# Patient Record
Sex: Female | Born: 1960 | Hispanic: Yes | Marital: Married | Smoking: Never smoker
Health system: Southern US, Community
[De-identification: ages and names within clinical notes are randomized; demographics above are authoritative.]

## PROBLEM LIST (undated history)

## (undated) ENCOUNTER — Emergency Department (HOSPITAL_BASED_OUTPATIENT_CLINIC_OR_DEPARTMENT_OTHER): Payer: BC Managed Care – PPO | Source: Home / Self Care

## (undated) ENCOUNTER — Emergency Department (HOSPITAL_BASED_OUTPATIENT_CLINIC_OR_DEPARTMENT_OTHER): Discharge status: Absent without leave

## (undated) DIAGNOSIS — I10 Essential (primary) hypertension: Secondary | ICD-10-CM

## (undated) DIAGNOSIS — K219 Gastro-esophageal reflux disease without esophagitis: Secondary | ICD-10-CM

## (undated) HISTORY — PX: CHOLECYSTECTOMY: SHX55

---

## 1998-06-09 ENCOUNTER — Other Ambulatory Visit: Admission: RE | Admit: 1998-06-09 | Discharge: 1998-06-09 | Payer: Self-pay | Admitting: Gynecology

## 1998-07-28 ENCOUNTER — Emergency Department (HOSPITAL_COMMUNITY): Admission: EM | Admit: 1998-07-28 | Discharge: 1998-07-29 | Payer: Self-pay | Admitting: Emergency Medicine

## 2000-11-27 ENCOUNTER — Emergency Department (HOSPITAL_COMMUNITY): Admission: EM | Admit: 2000-11-27 | Discharge: 2000-11-27 | Payer: Self-pay | Admitting: *Deleted

## 2000-11-27 ENCOUNTER — Encounter: Payer: Self-pay | Admitting: Internal Medicine

## 2001-09-18 ENCOUNTER — Emergency Department (HOSPITAL_COMMUNITY): Admission: EM | Admit: 2001-09-18 | Discharge: 2001-09-18 | Payer: Self-pay | Admitting: Emergency Medicine

## 2007-01-27 ENCOUNTER — Emergency Department (HOSPITAL_COMMUNITY): Admission: EM | Admit: 2007-01-27 | Discharge: 2007-01-27 | Payer: Self-pay | Admitting: Emergency Medicine

## 2008-04-14 LAB — CONVERTED CEMR LAB: Pap Smear: NORMAL

## 2008-05-01 ENCOUNTER — Encounter: Payer: Self-pay | Admitting: Internal Medicine

## 2008-06-12 ENCOUNTER — Inpatient Hospital Stay (HOSPITAL_COMMUNITY): Admission: AD | Admit: 2008-06-12 | Discharge: 2008-06-12 | Payer: Self-pay | Admitting: Obstetrics and Gynecology

## 2008-09-29 ENCOUNTER — Ambulatory Visit: Payer: Self-pay | Admitting: Internal Medicine

## 2008-09-29 DIAGNOSIS — E785 Hyperlipidemia, unspecified: Secondary | ICD-10-CM

## 2008-09-29 DIAGNOSIS — I10 Essential (primary) hypertension: Secondary | ICD-10-CM | POA: Insufficient documentation

## 2010-01-28 ENCOUNTER — Emergency Department (HOSPITAL_BASED_OUTPATIENT_CLINIC_OR_DEPARTMENT_OTHER): Admission: EM | Admit: 2010-01-28 | Discharge: 2010-01-28 | Payer: Self-pay | Admitting: Emergency Medicine

## 2010-01-28 ENCOUNTER — Ambulatory Visit: Payer: Self-pay | Admitting: Diagnostic Radiology

## 2010-06-29 ENCOUNTER — Emergency Department (HOSPITAL_BASED_OUTPATIENT_CLINIC_OR_DEPARTMENT_OTHER)
Admission: EM | Admit: 2010-06-29 | Discharge: 2010-06-29 | Disposition: A | Discharge status: Other Acute Inpatient Hospital | Payer: Self-pay | Source: Home / Self Care | Admitting: Emergency Medicine

## 2010-06-29 ENCOUNTER — Inpatient Hospital Stay (HOSPITAL_COMMUNITY)
Admission: AD | Admit: 2010-06-29 | Discharge: 2010-06-30 | Payer: Self-pay | Source: Home / Self Care | Attending: Internal Medicine | Admitting: Internal Medicine

## 2010-06-30 LAB — BASIC METABOLIC PANEL
BUN: 14 mg/dL (ref 6–23)
CO2: 27 mEq/L (ref 19–32)
Calcium: 9.7 mg/dL (ref 8.4–10.5)
Chloride: 106 mEq/L (ref 96–112)
Creatinine, Ser: 0.7 mg/dL (ref 0.4–1.2)
GFR calc Af Amer: >60 mL/min (ref >60)
GFR calc non Af Amer: >60 mL/min (ref >60)
Glucose, Bld: 89 mg/dL (ref 70–99)
Potassium: 3.9 mEq/L (ref 3.5–5.1)
Sodium: 146 mEq/L — ABNORMAL HIGH (ref 135–145)

## 2010-06-30 LAB — CARDIAC PANEL(CRET KIN+CKTOT+MB+TROPI)
CK, MB: 2.2 ng/mL (ref 0.3–4.0)
Relative Index: 1.4 (ref 0.0–2.5)
Total CK: 152 U/L (ref 7–177)
Troponin I: <0.01 ng/mL (ref 0.00–0.06)

## 2010-06-30 LAB — DIFFERENTIAL
Basophils Absolute: 0 10*3/uL (ref 0.0–0.1)
Basophils Relative: 1 % (ref 0–1)
Eosinophils Absolute: 0.1 10*3/uL (ref 0.0–0.7)
Eosinophils Relative: 1 % (ref 0–5)
Lymphocytes Relative: 46 % (ref 12–46)
Lymphs Abs: 3 10*3/uL (ref 0.7–4.0)
Monocytes Absolute: 0.4 10*3/uL (ref 0.1–1.0)
Monocytes Relative: 6 % (ref 3–12)
Neutro Abs: 2.9 10*3/uL (ref 1.7–7.7)
Neutrophils Relative %: 46 % (ref 43–77)

## 2010-06-30 LAB — CBC
HCT: 37.5 % (ref 36.0–46.0)
Hemoglobin: 12.5 g/dL (ref 12.0–15.0)
MCH: 26.5 pg (ref 26.0–34.0)
MCHC: 33.3 g/dL (ref 30.0–36.0)
MCV: 79.4 fL (ref 78.0–100.0)
Platelets: 266 10*3/uL (ref 150–400)
RBC: 4.72 MIL/uL (ref 3.87–5.11)
RDW: 13.4 % (ref 11.5–15.5)
WBC: 6.4 10*3/uL (ref 4.0–10.5)

## 2010-06-30 LAB — POCT CARDIAC MARKERS
CKMB, poc: 2.4 ng/mL (ref 1.0–8.0)
Myoglobin, poc: 55.1 ng/mL (ref 12–200)
Troponin i, poc: <0.05 ng/mL (ref 0.00–0.09)

## 2010-06-30 LAB — D-DIMER, QUANTITATIVE: D-Dimer, Quant: <0.22 ug/mL-FEU (ref 0.00–0.48)

## 2010-07-05 LAB — HEMOGLOBIN A1C
Hgb A1c MFr Bld: 5.8 % — ABNORMAL HIGH (ref <5.7)
Mean Plasma Glucose: 120 mg/dL — ABNORMAL HIGH (ref <117)

## 2010-07-05 LAB — TSH: TSH: 2.152 u[IU]/mL (ref 0.350–4.500)

## 2010-07-05 NOTE — Discharge Summary (Addendum)
  NAMEERIK, NESSEL             ACCOUNT NO.:  [id number]  MEDICAL RECORD NO.:  [id number]          PATIENT TYPE:  INP  LOCATION:  2038                         FACILITY:  [facility]  PHYSICIAN:  Tarry Kos, MD       DATE OF BIRTH:  February 09, 1961  DATE OF ADMISSION:  06/29/2010 DATE OF DISCHARGE:  06/30/2010                              DISCHARGE SUMMARY   DISCHARGE DIAGNOSES: 1. Atypical chest pain/left shoulder pain. 2. Newly-diagnosed hypertension.  SUMMARY OF HOSPITAL COURSE:  Ms. Fruchter is a pleasant 50 year old African American female who presented to the emergency room with atypical chest pain.  Please see admitting H and P for details.  She was admitted.  She had serial cardiac enzymes, which were all negative.  She was found to be hypertensive and was started on low-dose metoprolol.  PHYSICAL EXAMINATION:  VITAL SIGNS:  She had consistent systolic blood pressures over 161.  This morning, it is 136/86 with a pulse of 72.  She has been afebrile.  Her vital signs have been stable. GENERAL:  She is alert and oriented x4, no apparent distress, cooperative fairly. HEART:  Regular rate and rhythm without murmurs, rubs, or gallops. CHEST:  Clear to auscultation bilaterally.  No wheezing or rales. ABDOMEN:  Soft, nontender, nondistended.  Positive bowel sounds.  No hepatomegaly. EXTREMITIES:  No clubbing, cyanosis, or edema. PSYCHIATRIC:  Normal mood and affect. NEUROLOGIC:  No focal neurologic deficits. SKIN:  No rashes.  Her serial cardiac enzymes were all negative.  Her TSH was normal. Hemoglobin A1c was 5.8%.  D-dimer was negative.  Electrolytes normal. BUN and creatinine normal.  White count normal.  Chest x-ray negative.  The patient is being discharged home to follow up with her primary care physician in 1-2 weeks.  She is being discharged on the following medications, which are new to her. 1. Aspirin 81 mg a day. 2. Metoprolol 12.5 mg twice a day. 3. Multivitamin  a day.  Continue home medication:  Meloxicam 15 mg daily as needed for pain.  She will be discharged in improved and stable condition.          ______________________________ Tarry Kos, MD     RD/MEDQ  D:  06/30/2010  T:  07/01/2010  Job:  [id number]  Electronically Signed by Eldridge Dace MD on 07/05/2010 01:53:19 PM

## 2010-07-10 NOTE — H&P (Addendum)
Christina [name], Christina [name]             ACCOUNT NO.:  [id number]  MEDICAL RECORD NO.:  [id number]          PATIENT TYPE:  INP  LOCATION:  2038                         FACILITY:  [facility]  PHYSICIAN:  Michiel Cowboy, MDDATE OF BIRTH:  February 14, 1961  DATE OF ADMISSION:  06/29/2010 DATE OF DISCHARGE:                             HISTORY & PHYSICAL   PRIMARY CARE PROVIDER:  Rafaela Aguillera   CHIEF COMPLAINT:  Left shoulder pain and feeling unwell.  HISTORY OF PRESENT ILLNESS:  The patient is a 50 year old female with past medical history significant for intermittent elevated blood pressure who yesterday started to have  intermittent pain in the left shoulder which is somewhat reproducible per the patient.  She thought maybe she pulled a muscle.  Today when she woke up, she felt unwell, somewhat lightheaded and she actually stated she might have felt somewhat confused but this was short-lived.  But she wanted to get this investigated and she presented to emergency department in ER.  Her chest pain almost completely resolved but [facility] hospitalist was called for an admission to rule out chest pain given elevated blood pressure which is untreated.  REVIEW OF SYSTEMS:  She denies any shortness of breath.  No fevers, no chills, no cough.  The pain is nonpleuritic.  Otherwise review of systems is negative.  PAST MEDICAL HISTORY:  Significant for occasionally elevated blood pressure which she thought was secondary to her being stressed out whenever she gets her blood pressure checked at the physicians.  But she stated sometimes it gets elevated when she is also checking it at [facility].  Also has history of dysmenorrhea.  SOCIAL HISTORY:  The patient does not smoke or drink; does not abuse drugs.  Married with two children.  FAMILY HISTORY:  Significant for father who died of coronary artery disease at age of 27.  Her mother is still living and healthy.  ALLERGIES:  The patient states  repeatedly her mother has allergies to penicillin.  But she does not know if she has one.  She just never tried to take  any penicillin.  SO ACTUALLY SHE DOES NOT HAVE ANY KNOWN DRUG ALLERGIES.  MEDICATIONS:  She occasionally takes nSAIDS as needed for back pains and multivitamins.  PHYSICAL EXAMINATION:  VITALS:  Temperature 160/95, pulse 60, satting 100% on room air, respirations 16, temperature 98.3.  Of note, when the patient presented to the emergency department, her blood pressure was elevated at 191/98.  LABORATORY DATA:  White blood cell count 6.4, hemoglobin 12.5.  Sodium 146, potassium 3.9, creatinine 0.7.  Cardiac enzymes unremarkable. Chest x-ray is not showing any cardiopulmonary disease.  EKG:  The one that was sent from [facility] ED, is actually on the wrong patient.  No EKG is in the  system.  ASSESSMENT/PLAN:  This is a 50 year old female with what I think is now diagnosis of hypertension given recurrent episodes of elevated blood pressure in the office and chest pain. 1. Chest pain.  Given her risk factors, will admit.  Cycle cardiac     enzymes, check fasting lipid panel, hemoglobin A1c.  Check TSH. 2. Hypertension.  This is  a new diagnosis.  Will start her right now     on metoprolol and p.r.n. hydralazine. 3. Prophylaxis Protonix and Lovenox. Otherwise the rest of the plan as per orders.      Michiel Cowboy, MD     AVD/MEDQ  D:  06/29/2010  T:  06/29/2010  Job:  [id number]  cc:   Katrinka Blazing  Electronically Signed by Therisa Doyne MD on 07/10/2010 06:21:53 AM

## 2010-08-27 LAB — BASIC METABOLIC PANEL
BUN: 15 mg/dL (ref 6–23)
CO2: 27 mEq/L (ref 19–32)
Calcium: 9.6 mg/dL (ref 8.4–10.5)
Chloride: 106 mEq/L (ref 96–112)
Creatinine, Ser: 0.7 mg/dL (ref 0.4–1.2)
GFR calc Af Amer: >60 mL/min (ref >60)
GFR calc non Af Amer: >60 mL/min (ref >60)
Glucose, Bld: 86 mg/dL (ref 70–99)
Potassium: 4.1 mEq/L (ref 3.5–5.1)
Sodium: 144 mEq/L (ref 135–145)

## 2010-08-27 LAB — DIFFERENTIAL
Basophils Absolute: 0.1 10*3/uL (ref 0.0–0.1)
Basophils Relative: 1 % (ref 0–1)
Eosinophils Absolute: 0.1 10*3/uL (ref 0.0–0.7)
Eosinophils Relative: 1 % (ref 0–5)
Lymphocytes Relative: 41 % (ref 12–46)
Lymphs Abs: 2.9 10*3/uL (ref 0.7–4.0)
Monocytes Absolute: 0.5 10*3/uL (ref 0.1–1.0)
Monocytes Relative: 7 % (ref 3–12)
Neutro Abs: 3.5 10*3/uL (ref 1.7–7.7)
Neutrophils Relative %: 50 % (ref 43–77)

## 2010-08-27 LAB — CBC
HCT: 40.7 % (ref 36.0–46.0)
Hemoglobin: 13.4 g/dL (ref 12.0–15.0)
MCH: 27.1 pg (ref 26.0–34.0)
MCHC: 33 g/dL (ref 30.0–36.0)
MCV: 82.2 fL (ref 78.0–100.0)
Platelets: 280 10*3/uL (ref 150–400)
RBC: 4.95 MIL/uL (ref 3.87–5.11)
RDW: 12.7 % (ref 11.5–15.5)
WBC: 7.1 10*3/uL (ref 4.0–10.5)

## 2010-08-27 LAB — POCT CARDIAC MARKERS
CKMB, poc: 1.4 ng/mL (ref 1.0–8.0)
CKMB, poc: <1 ng/mL — ABNORMAL LOW (ref 1.0–8.0)
Myoglobin, poc: 30.3 ng/mL (ref 12–200)
Myoglobin, poc: 50 ng/mL (ref 12–200)
Troponin i, poc: <0.05 ng/mL (ref 0.00–0.09)
Troponin i, poc: <0.05 ng/mL (ref 0.00–0.09)

## 2011-03-18 LAB — POCT PREGNANCY, URINE: Preg Test, Ur: NEGATIVE

## 2011-03-22 ENCOUNTER — Emergency Department (HOSPITAL_COMMUNITY)
Admission: EM | Admit: 2011-03-22 | Discharge: 2011-03-22 | Disposition: A | Discharge status: Home / Self Care | Payer: Managed Care, Other (non HMO) | Attending: Emergency Medicine | Admitting: Emergency Medicine

## 2011-03-22 DIAGNOSIS — E785 Hyperlipidemia, unspecified: Secondary | ICD-10-CM | POA: Insufficient documentation

## 2011-03-22 DIAGNOSIS — I10 Essential (primary) hypertension: Secondary | ICD-10-CM | POA: Insufficient documentation

## 2011-03-22 DIAGNOSIS — N946 Dysmenorrhea, unspecified: Secondary | ICD-10-CM | POA: Insufficient documentation

## 2011-03-22 DIAGNOSIS — R109 Unspecified abdominal pain: Secondary | ICD-10-CM | POA: Insufficient documentation

## 2011-03-22 LAB — URINALYSIS, ROUTINE W REFLEX MICROSCOPIC
Bilirubin Urine: NEGATIVE
Glucose, UA: NEGATIVE mg/dL
Ketones, ur: NEGATIVE mg/dL
Leukocytes, UA: NEGATIVE
Nitrite: NEGATIVE
Protein, ur: NEGATIVE mg/dL
Specific Gravity, Urine: 1.012 (ref 1.005–1.030)
Urobilinogen, UA: 0.2 mg/dL (ref 0.0–1.0)
pH: 8 (ref 5.0–8.0)

## 2011-03-22 LAB — URINE MICROSCOPIC-ADD ON

## 2011-03-22 LAB — POCT PREGNANCY, URINE: Preg Test, Ur: NEGATIVE

## 2011-03-25 LAB — URINE MICROSCOPIC-ADD ON

## 2011-03-25 LAB — DIFFERENTIAL
Lymphs Abs: 0.9
Monocytes Relative: 5
Neutro Abs: 10.8 — ABNORMAL HIGH
Neutrophils Relative %: 87 — ABNORMAL HIGH

## 2011-03-25 LAB — CBC
HCT: 40.3
Hemoglobin: 13.6
MCHC: 33.7
MCV: 81.1
Platelets: 312
RBC: 4.97
RDW: 13.1
WBC: 12.4 — ABNORMAL HIGH

## 2011-03-25 LAB — URINALYSIS, ROUTINE W REFLEX MICROSCOPIC
Bilirubin Urine: NEGATIVE
Glucose, UA: NEGATIVE
Ketones, ur: NEGATIVE
Leukocytes, UA: NEGATIVE
Nitrite: NEGATIVE
Protein, ur: 30 — AB
Specific Gravity, Urine: 1.031 — ABNORMAL HIGH
Urobilinogen, UA: 1
pH: 5.5

## 2011-03-25 LAB — COMPREHENSIVE METABOLIC PANEL
BUN: 14
Calcium: 9.5
Creatinine, Ser: 0.75
Glucose, Bld: 124 — ABNORMAL HIGH
Total Protein: 7.6

## 2011-03-25 LAB — PREGNANCY, URINE: Preg Test, Ur: NEGATIVE

## 2011-08-10 ENCOUNTER — Encounter (HOSPITAL_COMMUNITY): Payer: Self-pay | Admitting: Emergency Medicine

## 2011-08-10 ENCOUNTER — Emergency Department (HOSPITAL_COMMUNITY): Payer: Managed Care, Other (non HMO)

## 2011-08-10 ENCOUNTER — Emergency Department (HOSPITAL_COMMUNITY)
Admission: EM | Admit: 2011-08-10 | Discharge: 2011-08-10 | Disposition: A | Discharge status: Home / Self Care | Payer: Managed Care, Other (non HMO) | Attending: Emergency Medicine | Admitting: Emergency Medicine

## 2011-08-10 DIAGNOSIS — I10 Essential (primary) hypertension: Secondary | ICD-10-CM | POA: Insufficient documentation

## 2011-08-10 DIAGNOSIS — K5900 Constipation, unspecified: Secondary | ICD-10-CM | POA: Insufficient documentation

## 2011-08-10 DIAGNOSIS — R109 Unspecified abdominal pain: Secondary | ICD-10-CM | POA: Insufficient documentation

## 2011-08-10 DIAGNOSIS — R141 Gas pain: Secondary | ICD-10-CM | POA: Insufficient documentation

## 2011-08-10 DIAGNOSIS — R142 Eructation: Secondary | ICD-10-CM | POA: Insufficient documentation

## 2011-08-10 HISTORY — DX: Essential (primary) hypertension: I10

## 2011-08-10 LAB — URINALYSIS, ROUTINE W REFLEX MICROSCOPIC
Bilirubin Urine: NEGATIVE
Glucose, UA: NEGATIVE mg/dL
Ketones, ur: NEGATIVE mg/dL
Nitrite: NEGATIVE
pH: 6 (ref 5.0–8.0)

## 2011-08-10 LAB — HEPATIC FUNCTION PANEL
ALT: 21 U/L (ref 0–35)
AST: 18 U/L (ref 0–37)
Alkaline Phosphatase: 109 U/L (ref 39–117)
Total Protein: 7.8 g/dL (ref 6.0–8.3)

## 2011-08-10 LAB — DIFFERENTIAL
Eosinophils Relative: 1 % (ref 0–5)
Lymphocytes Relative: 44 % (ref 12–46)
Lymphs Abs: 3.7 10*3/uL (ref 0.7–4.0)
Monocytes Absolute: 0.6 10*3/uL (ref 0.1–1.0)
Neutro Abs: 3.9 10*3/uL (ref 1.7–7.7)

## 2011-08-10 LAB — CBC
HCT: 39.1 % (ref 36.0–46.0)
MCV: 82 fL (ref 78.0–100.0)
Platelets: 285 10*3/uL (ref 150–400)
RBC: 4.77 MIL/uL (ref 3.87–5.11)
WBC: 8.2 10*3/uL (ref 4.0–10.5)

## 2011-08-10 LAB — URINE MICROSCOPIC-ADD ON

## 2011-08-10 LAB — BASIC METABOLIC PANEL
BUN: 14 mg/dL (ref 6–23)
CO2: 27 mEq/L (ref 19–32)
Chloride: 103 mEq/L (ref 96–112)
Glucose, Bld: 100 mg/dL — ABNORMAL HIGH (ref 70–99)
Potassium: 3.8 mEq/L (ref 3.5–5.1)
Sodium: 138 mEq/L (ref 135–145)

## 2011-08-10 MED ORDER — MORPHINE SULFATE 4 MG/ML IJ SOLN
4.0000 mg | Freq: Once | INTRAMUSCULAR | Status: AC
  Administered 2011-08-10: 4 mg via INTRAVENOUS
  Filled 2011-08-10: qty 1

## 2011-08-10 MED ORDER — SODIUM CHLORIDE 0.9 % IV BOLUS (SEPSIS)
1000.0000 mL | Freq: Once | INTRAVENOUS | Status: AC
  Administered 2011-08-10: 1000 mL via INTRAVENOUS

## 2011-08-10 MED ORDER — IOHEXOL 300 MG/ML  SOLN
100.0000 mL | Freq: Once | INTRAMUSCULAR | Status: AC | PRN
  Administered 2011-08-10: 100 mL via INTRAVENOUS

## 2011-08-10 MED ORDER — ONDANSETRON HCL 4 MG/2ML IJ SOLN
4.0000 mg | Freq: Once | INTRAMUSCULAR | Status: AC
  Administered 2011-08-10: 4 mg via INTRAVENOUS
  Filled 2011-08-10: qty 2

## 2011-08-10 MED ORDER — POLYETHYLENE GLYCOL 3350 17 GM/SCOOP PO POWD: 17.0000 g | Freq: Every day | ORAL | Status: AC

## 2011-08-10 NOTE — ED Notes (Signed)
Pt instructed that she may feel funny after meds given and she did she is getting better now.  A friend is here to sit with her

## 2011-08-10 NOTE — Discharge Instructions (Signed)
Return to the ED with any concerns including vomiting, not able to keep down liquids, fever, worsening pain, decreased level of alertness/lethargy, or any other alarming symptoms

## 2011-08-10 NOTE — ED Notes (Signed)
Pt alert, nad, c/o left abd pain, onset last pm, denies changes in bowels or bladder, pain is non radiating

## 2011-08-10 NOTE — ED Provider Notes (Signed)
History     CSN: [id number]  Arrival date & time 08/10/11  4098   First MD Initiated Contact with Patient 08/10/11 [phone]      Chief Complaint  Patient presents with  . Abdominal Pain    (Consider location/radiation/quality/duration/timing/severity/associated sxs/prior treatment) HPI Patient presents with complaint of pain in the left side of her abdomen. She states the pain began yesterday and has been constant. She describes the pain as sharp. She denies any changes in her bowel movements and last stool was yesterday without blood. She denies dysuria, urinary urgency or frequency. She's had no fevers or chills or vomiting. Pain is worse with palpation. She has not had similar pain in the past. There no other associated systemic symptoms. There no other alleviating or modifying factors associated.  Past Medical History  Diagnosis Date  . Hypertension     Past Surgical History  Procedure Date  . Cholecystectomy   . Cesarean section     No family history on file.  History  Substance Use Topics  . Smoking status: Never Smoker   . Smokeless tobacco: Not on file  . Alcohol Use:     OB History    Grav Para Term Preterm Abortions TAB SAB Ect Mult Living                  Review of Systems ROS reviewed and otherwise negative except for mentioned in HPI  Allergies  Review of patient's allergies indicates no known allergies.  Home Medications   Current Outpatient Rx  Name Route Sig Dispense Refill  . METOPROLOL SUCCINATE ER 50 MG PO TB24 Oral Take 25 mg by mouth daily. Take with or immediately following a meal.    . POLYETHYLENE GLYCOL 3350 PO POWD Oral Take 17 g by mouth daily. 255 g 0    BP 138/81  Pulse 73  Temp(Src) 98.6 F (37 C) (Oral)  Resp 16  Wt 202 lb (91.627 kg)  SpO2 100% Vitals reviewed Physical Exam Physical Examination: General appearance - alert, well appearing, and in no distress Mental status - alert, oriented to person, place, and time Mouth  - mucous membranes moist, pharynx normal without lesions Chest - clear to auscultation, no wheezes, rales or rhonchi, symmetric air entry Heart - normal rate, regular rhythm, normal S1, S2, no murmurs, rubs, clicks or gallops Abdomen - soft, mild tenderness to palpation in left mid abdomen, no gaurding or rebound tenderness, nondistended, no masses or organomegaly, NABS Extremities - peripheral pulses normal, no pedal edema, no clubbing or cyanosis Skin - normal coloration and turgor, no rashes  ED Course  Procedures (including critical care time)  Labs Reviewed  URINALYSIS, ROUTINE W REFLEX MICROSCOPIC - Abnormal; Notable for the following:    APPearance CLOUDY (*)    Hgb urine dipstick TRACE (*)    All other components within normal limits  BASIC METABOLIC PANEL - Abnormal; Notable for the following:    Glucose, Bld 100 (*)    All other components within normal limits  URINE MICROSCOPIC-ADD ON - Abnormal; Notable for the following:    Squamous Epithelial / LPF MANY (*)    All other components within normal limits  CBC  DIFFERENTIAL  HEPATIC FUNCTION PANEL  LIPASE, BLOOD   Ct Abdomen Pelvis W Contrast  08/10/2011  *RADIOLOGY REPORT*  Clinical Data: Left upper quadrant and epigastric pain.  Abdominal distention.  CT ABDOMEN AND PELVIS WITH CONTRAST  Technique:  Multidetector CT imaging of the abdomen and pelvis was  performed following the standard protocol during bolus administration of intravenous contrast.  Contrast:  100 ml Omnipaque-300.  Comparison: None.  Findings: Lung bases show no acute findings.  Heart size normal. No pericardial or pleural effusion.  Partially imaged probable right infrahilar pericardial recess (image 1).  Liver is unremarkable.  Cholecystectomy.  A 7 mm low attenuation lesion in the interpolar right kidney is too small to characterize. Kidneys, spleen, pancreas, stomach and small bowel are unremarkable.  A fair amount of stool is present in the colon.  Uterus  and ovaries are visualized.  No pathologically enlarged lymph nodes.  No free fluid.  No worrisome lytic or sclerotic lesions. A focal area of smudgy sclerosis in the L3 vertebral body has a benign imaging features.  IMPRESSION:  1.  No acute findings. 2.  Suspect mild constipation.  Original Report Authenticated By: Reyes Ivan, M.D.     1. Abdominal pain   2. Constipation       MDM  Patient presenting with abdominal pain in her left lower abdomen. Her laboratory workup was reassuring. A CT scan reveals no acute findings but possibility of constipation. Patient will be discharged with prescription to start MiraLAX. She is nontoxic and well-hydrated in appearance. She was given strict return precautions and is agreeable with the plan for discharge.        Ethelda Chick, MD 08/10/11 1110

## 2011-08-13 ENCOUNTER — Emergency Department (INDEPENDENT_AMBULATORY_CARE_PROVIDER_SITE_OTHER): Payer: Managed Care, Other (non HMO)

## 2011-08-13 ENCOUNTER — Emergency Department (HOSPITAL_BASED_OUTPATIENT_CLINIC_OR_DEPARTMENT_OTHER)
Admission: EM | Admit: 2011-08-13 | Discharge: 2011-08-13 | Disposition: A | Discharge status: Home / Self Care | Payer: Managed Care, Other (non HMO) | Attending: Emergency Medicine | Admitting: Emergency Medicine

## 2011-08-13 ENCOUNTER — Encounter (HOSPITAL_BASED_OUTPATIENT_CLINIC_OR_DEPARTMENT_OTHER): Payer: Self-pay | Admitting: *Deleted

## 2011-08-13 ENCOUNTER — Other Ambulatory Visit: Payer: Self-pay

## 2011-08-13 DIAGNOSIS — Z79899 Other long term (current) drug therapy: Secondary | ICD-10-CM | POA: Insufficient documentation

## 2011-08-13 DIAGNOSIS — I10 Essential (primary) hypertension: Secondary | ICD-10-CM | POA: Insufficient documentation

## 2011-08-13 DIAGNOSIS — R110 Nausea: Secondary | ICD-10-CM | POA: Insufficient documentation

## 2011-08-13 DIAGNOSIS — K5900 Constipation, unspecified: Secondary | ICD-10-CM

## 2011-08-13 DIAGNOSIS — R1013 Epigastric pain: Secondary | ICD-10-CM | POA: Insufficient documentation

## 2011-08-13 LAB — URINALYSIS, ROUTINE W REFLEX MICROSCOPIC
Nitrite: NEGATIVE
Protein, ur: NEGATIVE mg/dL
Specific Gravity, Urine: 1.024 (ref 1.005–1.030)
Urobilinogen, UA: 2 mg/dL — ABNORMAL HIGH (ref 0.0–1.0)

## 2011-08-13 LAB — COMPREHENSIVE METABOLIC PANEL
Alkaline Phosphatase: 140 U/L — ABNORMAL HIGH (ref 39–117)
BUN: 15 mg/dL (ref 6–23)
CO2: 27 mEq/L (ref 19–32)
Chloride: 105 mEq/L (ref 96–112)
Creatinine, Ser: 0.7 mg/dL (ref 0.50–1.10)
GFR calc Af Amer: >90 mL/min (ref >90)
GFR calc non Af Amer: >90 mL/min (ref >90)
Glucose, Bld: 107 mg/dL — ABNORMAL HIGH (ref 70–99)
Potassium: 3.6 mEq/L (ref 3.5–5.1)
Total Bilirubin: 0.3 mg/dL (ref 0.3–1.2)

## 2011-08-13 LAB — DIFFERENTIAL
Basophils Relative: 0 % (ref 0–1)
Lymphs Abs: 3.1 10*3/uL (ref 0.7–4.0)
Monocytes Absolute: 0.5 10*3/uL (ref 0.1–1.0)
Monocytes Relative: 7 % (ref 3–12)
Neutro Abs: 3.5 10*3/uL (ref 1.7–7.7)
Neutrophils Relative %: 48 % (ref 43–77)

## 2011-08-13 LAB — CBC
HCT: 37 % (ref 36.0–46.0)
Hemoglobin: 12 g/dL (ref 12.0–15.0)
MCHC: 32.4 g/dL (ref 30.0–36.0)
RBC: 4.55 MIL/uL (ref 3.87–5.11)

## 2011-08-13 LAB — LIPASE, BLOOD: Lipase: 56 U/L (ref 11–59)

## 2011-08-13 LAB — TROPONIN I: Troponin I: <0.3 ng/mL (ref <0.30)

## 2011-08-13 LAB — URINE MICROSCOPIC-ADD ON

## 2011-08-13 MED ORDER — PANTOPRAZOLE SODIUM 40 MG IV SOLR
40.0000 mg | Freq: Once | INTRAVENOUS | Status: AC
  Administered 2011-08-13: 40 mg via INTRAVENOUS
  Filled 2011-08-13: qty 40

## 2011-08-13 MED ORDER — DIPHENHYDRAMINE HCL 50 MG/ML IJ SOLN
25.0000 mg | Freq: Once | INTRAMUSCULAR | Status: AC
  Administered 2011-08-13: 25 mg via INTRAVENOUS
  Filled 2011-08-13: qty 1

## 2011-08-13 MED ORDER — ONDANSETRON HCL 4 MG/2ML IJ SOLN
4.0000 mg | Freq: Once | INTRAMUSCULAR | Status: AC
  Administered 2011-08-13: 4 mg via INTRAVENOUS
  Filled 2011-08-13: qty 2

## 2011-08-13 MED ORDER — SODIUM CHLORIDE 0.9 % IV BOLUS (SEPSIS)
1000.0000 mL | Freq: Once | INTRAVENOUS | Status: AC
  Administered 2011-08-13: 1000 mL via INTRAVENOUS

## 2011-08-13 MED ORDER — GI COCKTAIL ~~LOC~~
30.0000 mL | Freq: Once | ORAL | Status: AC
  Administered 2011-08-13: 30 mL via ORAL
  Filled 2011-08-13: qty 30

## 2011-08-13 MED ORDER — SENNOSIDES-DOCUSATE SODIUM 8.6-50 MG PO TABS: 1.0000 | ORAL_TABLET | Freq: Every day | ORAL | Status: DC

## 2011-08-13 MED ORDER — BISACODYL 10 MG RE SUPP
10.0000 mg | Freq: Once | RECTAL | Status: AC
  Administered 2011-08-13: 10 mg via RECTAL
  Filled 2011-08-13: qty 1

## 2011-08-13 MED ORDER — POLYETHYLENE GLYCOL 3350 17 GM/SCOOP PO POWD: ORAL | Status: DC

## 2011-08-13 MED ORDER — HYDROMORPHONE HCL PF 1 MG/ML IJ SOLN
1.0000 mg | Freq: Once | INTRAMUSCULAR | Status: AC
  Administered 2011-08-13: 1 mg via INTRAVENOUS
  Filled 2011-08-13: qty 1

## 2011-08-13 NOTE — ED Notes (Signed)
Pt presents to ED today with LUQ pain for the last week.  Pt was seen at [facility] and discharged.

## 2011-08-13 NOTE — Discharge Instructions (Signed)
Constipation in Adults Constipation is having fewer than 2 bowel movements per week. Usually, the stools are hard. As we grow older, constipation is more common. If you try to fix constipation with laxatives, the problem may get worse. This is because laxatives taken over a long period of time make the colon muscles weaker. A low-fiber diet, not taking in enough fluids, and taking some medicines may make these problems worse. MEDICATIONS THAT MAY CAUSE CONSTIPATION  Water pills (diuretics).   Calcium channel blockers (used to control blood pressure and for the heart).   Certain pain medicines (narcotics).   Anticholinergics.   Anti-inflammatory agents.   Antacids that contain aluminum.  DISEASES THAT CONTRIBUTE TO CONSTIPATION  Diabetes.   Parkinson's disease.   Dementia.   Stroke.   Depression.   Illnesses that cause problems with salt and water metabolism.  HOME CARE INSTRUCTIONS   Constipation is usually best cared for without medicines. Increasing dietary fiber and eating more fruits and vegetables is the best way to manage constipation.   Slowly increase fiber intake to 25 to 38 grams per day. Whole grains, fruits, vegetables, and legumes are good sources of fiber. A dietitian can further help you incorporate high-fiber foods into your diet.   Drink enough water and fluids to keep your urine clear or pale yellow.   A fiber supplement may be added to your diet if you cannot get enough fiber from foods.   Increasing your activities also helps improve regularity.   Suppositories, as suggested by your caregiver, will also help. If you are using antacids, such as aluminum or calcium containing products, it will be helpful to switch to products containing magnesium if your caregiver says it is okay.   If you have been given a liquid injection (enema) today, this is only a temporary measure. It should not be relied on for treatment of longstanding (chronic) constipation.    Stronger measures, such as magnesium sulfate, should be avoided if possible. This may cause uncontrollable diarrhea. Using magnesium sulfate may not allow you time to make it to the bathroom.  SEEK IMMEDIATE MEDICAL CARE IF:   There is bright red blood in the stool.   The constipation stays for more than 4 days.   There is belly (abdominal) or rectal pain.   You do not seem to be getting better.   You have any questions or concerns.  MAKE SURE YOU:   Understand these instructions.   Will watch your condition.   Will get help right away if you are not doing well or get worse.  Document Released: 02/26/2004 Document Revised: 02/09/2011 Document Reviewed: 01/16/2008 [facility] Patient Information 2012 [place], [place].  Purchase a Fleet enema over the counter at the drug store Once you complete the bowel regimen take miralax twice daily  You had a slight elevation of the liver enzymes which is non-specific.  Follow up with your primary physician.

## 2011-08-13 NOTE — ED Provider Notes (Signed)
History   This chart was scribed for Christina Bailiff, MD by [name] . The patient was seen in room MH03/MH03 and the patient's care was started at 9:38pm.  CSN: [id number]  Arrival date & time 08/13/11  2107   First MD Initiated Contact with Patient 08/13/11 2122      Chief Complaint  Patient presents with  . Abdominal Pain    (Consider location/radiation/quality/duration/timing/severity/associated sxs/prior treatment) HPI Christina [name] is a 51 y.o. female who presents to the Emergency Department complaining of constant, moderate LUQ abdominal pain for the last week. Patient states that she was previously seen at [facility] and was discharged with a prescription for Miralax and diagnosis with constipation. Patient denies vomiting, diarrhea, and dysuria. Patient states that she has had some associated nausea. Patient reports no other symptoms. Patient states that she has been taking her medications as prescribed. Patient also states that she has been having normal BM's. Patient notes that her past surgical hx consists of a cholecystectomy and a c-section.    Past Medical History  Diagnosis Date  . Hypertension     Past Surgical History  Procedure Date  . Cholecystectomy   . Cesarean section     History reviewed. No pertinent family history.  History  Substance Use Topics  . Smoking status: Never Smoker   . Smokeless tobacco: Not on file  . Alcohol Use:     OB History    Grav Para Term Preterm Abortions TAB SAB Ect Mult Living                  Review of Systems  Constitutional: Negative for fever.  Gastrointestinal: Positive for nausea and abdominal pain. Negative for vomiting and diarrhea.  Genitourinary: Negative for dysuria.  All other systems reviewed and are negative.    Allergies  Morphine and related  Home Medications   Current Outpatient Rx  Name Route Sig Dispense Refill  . IBUPROFEN 200 MG PO TABS Oral Take 200 mg by mouth once.    Marland Kitchen METOPROLOL  SUCCINATE ER 50 MG PO TB24 Oral Take 25 mg by mouth daily. Take with or immediately following a meal.    . POLYETHYLENE GLYCOL 3350 PO POWD Oral Take 17 g by mouth daily. 255 g 0  . POLYETHYLENE GLYCOL 3350 PO POWD  Mix an entire 255 gram bottle of miralax with 64 ounces of gatorade and drink the entirety of the contents. 255 g 0  . SENNOSIDES-DOCUSATE SODIUM 8.6-50 MG PO TABS Oral Take 1 tablet by mouth daily. 30 tablet 0    BP 152/80  Pulse 70  Temp(Src) 97.7 F (36.5 C) (Oral)  Resp 20  SpO2 100%  Physical Exam  Nursing note and vitals reviewed. Constitutional: She is oriented to person, place, and time. She appears well-developed and well-nourished. No distress.  HENT:  Head: Normocephalic and atraumatic.  Eyes: EOM are normal. Pupils are equal, round, and reactive to light.  Neck: Normal range of motion. Neck supple. No tracheal deviation present.  Cardiovascular: Normal rate, regular rhythm and normal heart sounds.  Exam reveals no gallop and no friction rub.   No murmur heard. Pulmonary/Chest: Effort normal and breath sounds normal. No respiratory distress. She has no wheezes. She has no rales.  Abdominal: Soft. Bowel sounds are normal. She exhibits no distension. There is tenderness (Epigastric and LUQ tenderness. Abdominal exam was difficult to preform due to patient grabbing my hands. ). There is no rebound and no guarding.  Musculoskeletal: Normal  range of motion. She exhibits no edema.  Neurological: She is alert and oriented to person, place, and time. No sensory deficit.  Skin: Skin is warm and dry.  Psychiatric: She has a normal mood and affect. Her behavior is normal.    ED Course  Procedures (including critical care time)   Date: 08/13/2011  Rate: 80  Rhythm: normal sinus rhythm  QRS Axis: normal  Intervals: normal  ST/T Wave abnormalities: normal  Conduction Disutrbances:none  Narrative Interpretation:   Old EKG Reviewed: unchanged  DIAGNOSTIC  STUDIES: Oxygen Saturation is 100% on room air, normal by my interpretation.    COORDINATION OF CARE:  2144: Discussed with patient the planned course of treatment.  2200: Medication Orders: Diphenhydramine injection 25 mg-once; Sodium chloride 0.9% bolus 1,000 mL-once; GI cocktail-once; Pantoprazole injection 40 mg-once; Ondansetron injection 4 mg-once; Hydromorphone injection 1 mg-once.  2225: Recheck: Patient informed of imaging and lab results. 2230: Medication Orders: Bisacodyl suppository 10 mg-once.  2238: Recheck: Patient informed of diagnosis and planned d/c.   Labs Reviewed  URINALYSIS, ROUTINE W REFLEX MICROSCOPIC - Abnormal; Notable for the following:    APPearance CLOUDY (*)    Hgb urine dipstick TRACE (*)    Urobilinogen, UA 2.0 (*)    All other components within normal limits  COMPREHENSIVE METABOLIC PANEL - Abnormal; Notable for the following:    Glucose, Bld 107 (*)    AST 46 (*)    ALT 179 (*)    Alkaline Phosphatase 140 (*)    All other components within normal limits  URINE MICROSCOPIC-ADD ON - Abnormal; Notable for the following:    Squamous Epithelial / LPF MANY (*)    Bacteria, UA MANY (*)    All other components within normal limits  CBC  DIFFERENTIAL  LIPASE, BLOOD  TROPONIN I   Dg Abd Acute W/chest  08/13/2011  *RADIOLOGY REPORT*  Clinical Data: Constipation.  ACUTE ABDOMEN SERIES (ABDOMEN 2 VIEW & CHEST 1 VIEW)  Comparison: 08/10/2011  Findings: Heart size is normal.  No pleural effusion or pulmonary edema.  No airspace consolidation identified.  Cholecystectomy clips are present within the right upper quadrant of the abdomen.  There is gas and stool noted throughout the colon up to the level of the rectum.  No dilated loops of small bowel or air-fluid levels identified.  IMPRESSION:  1.  Nonobstructive bowel gas pattern.  Original Report Authenticated By: Rosealee Albee, M.D.     1. Constipation   2. Epigastric pain       MDM  Patient's pain is  secondary to constipation. Significant constipation seen on her acute abdominal series. This is the cause of her pain. There is nonspecific elevation of her LFTs which I do not feel is secondary to a cause contributing to her pain.  Instructed to follow up with gi.  Provided an aggressive bowel regimen.   I personally performed the services described in this documentation, which was scribed in my presence. The recorded information has been reviewed and considered.       Christina Bailiff, MD 08/13/11 2251

## 2011-08-13 NOTE — ED Notes (Signed)
Pt arrived to ED without indwelling IV.  Not charted as removed from previous encounter

## 2012-03-28 ENCOUNTER — Emergency Department (HOSPITAL_COMMUNITY)
Admission: EM | Admit: 2012-03-28 | Discharge: 2012-03-29 | Disposition: A | Discharge status: Home / Self Care | Payer: Managed Care, Other (non HMO) | Attending: Emergency Medicine | Admitting: Emergency Medicine

## 2012-03-28 ENCOUNTER — Encounter (HOSPITAL_COMMUNITY): Payer: Self-pay | Admitting: Emergency Medicine

## 2012-03-28 DIAGNOSIS — H5710 Ocular pain, unspecified eye: Secondary | ICD-10-CM | POA: Insufficient documentation

## 2012-03-28 DIAGNOSIS — H5712 Ocular pain, left eye: Secondary | ICD-10-CM

## 2012-03-28 MED ORDER — TETRACAINE HCL 0.5 % OP SOLN
2.0000 [drp] | Freq: Once | OPHTHALMIC | Status: AC
  Administered 2012-03-29: 2 [drp] via OPHTHALMIC
  Filled 2012-03-28: qty 2

## 2012-03-28 MED ORDER — FLUORESCEIN SODIUM 1 MG OP STRP
1.0000 | ORAL_STRIP | Freq: Once | OPHTHALMIC | Status: AC
  Administered 2012-03-29: via OPHTHALMIC
  Filled 2012-03-28: qty 1

## 2012-03-28 NOTE — ED Provider Notes (Signed)
History     CSN: [id number]  Arrival date & time 03/28/12  2145   First MD Initiated Contact with Patient 03/28/12 2218      Chief Complaint  Patient presents with  . Eye Injury   HPI  History provided by the patient. Patient is a 51 year old female presents with complaints of left eye pain after getting cleaning chemical in eye. Patient states a small amount of cleaning chemical called Amaryl splashed into her. She reports having slight burning in seen sensation. Patient immediately washed eye with water reports having improvement of symptoms with time. She still patient has slight burning sensation but generally reports very minimal pain. She denies any blurred vision or vision change. No pain with movement. No drainage from eye.     Past Medical History  Diagnosis Date  . Hypertension     Past Surgical History  Procedure Date  . Cholecystectomy   . Cesarean section     No family history on file.  History  Substance Use Topics  . Smoking status: Never Smoker   . Smokeless tobacco: Not on file  . Alcohol Use: No    OB History    Grav Para Term Preterm Abortions TAB SAB Ect Mult Living                  Review of Systems  Eyes: Positive for pain and redness. Negative for photophobia, discharge and visual disturbance.  Neurological: Negative for headaches.    Allergies  Morphine and related  Home Medications   Current Outpatient Rx  Name Route Sig Dispense Refill  . DIPHENHYDRAMINE HCL 25 MG PO CAPS Oral Take 25 mg by mouth every 6 (six) hours as needed. For itching      BP 158/90  Pulse 72  Temp 98.6 F (37 C) (Oral)  Resp 16  SpO2 98%  Physical Exam  Nursing note and vitals reviewed. Constitutional: She is oriented to person, place, and time. She appears well-developed and well-nourished. No distress.  HENT:  Head: Normocephalic.  Eyes: Conjunctivae normal and EOM are normal. Pupils are equal, round, and reactive to light. Right eye exhibits no  discharge. Left eye exhibits no discharge.       Exam was somewhat limited. Patient did not allow for tetracaine drops or fluorescein stain. No abrasions or ulcerations noted on slit-lamp exam.  Cardiovascular: Normal rate and regular rhythm.   Pulmonary/Chest: Effort normal and breath sounds normal. No respiratory distress. She has no wheezes. She has no rales.  Neurological: She is alert and oriented to person, place, and time.  Skin: Skin is warm and dry. No erythema.  Psychiatric: She has a normal mood and affect. Her behavior is normal.    ED Course  Procedures       1. Left eye pain       MDM  11:00 PM patient seen and evaluated. Patient appears in no acute distress. She states symptoms have been improving.       Christina [name], [place] 03/29/12 [phone]

## 2012-03-28 NOTE — ED Notes (Signed)
Pt got Emerel cleaner in L eye while working approx 25 min ago.  C/o redness and burning to L eye.

## 2012-03-30 NOTE — ED Provider Notes (Signed)
Medical screening examination/treatment/procedure(s) were performed by non-physician practitioner and as supervising physician I was immediately available for consultation/collaboration.   Quaniya Damas, MD 03/30/12 1531 

## 2012-08-18 ENCOUNTER — Emergency Department (HOSPITAL_BASED_OUTPATIENT_CLINIC_OR_DEPARTMENT_OTHER)
Admission: EM | Admit: 2012-08-18 | Discharge: 2012-08-19 | Disposition: A | Discharge status: Home / Self Care | Payer: Managed Care, Other (non HMO) | Attending: Emergency Medicine | Admitting: Emergency Medicine

## 2012-08-18 ENCOUNTER — Encounter (HOSPITAL_BASED_OUTPATIENT_CLINIC_OR_DEPARTMENT_OTHER): Payer: Self-pay | Admitting: *Deleted

## 2012-08-18 DIAGNOSIS — I10 Essential (primary) hypertension: Secondary | ICD-10-CM | POA: Insufficient documentation

## 2012-08-18 DIAGNOSIS — R079 Chest pain, unspecified: Secondary | ICD-10-CM | POA: Insufficient documentation

## 2012-08-18 DIAGNOSIS — R112 Nausea with vomiting, unspecified: Secondary | ICD-10-CM | POA: Insufficient documentation

## 2012-08-18 MED ORDER — ONDANSETRON 8 MG PO TBDP
8.0000 mg | ORAL_TABLET | Freq: Once | ORAL | Status: AC
  Administered 2012-08-18: 8 mg via ORAL

## 2012-08-18 MED ORDER — ONDANSETRON 8 MG PO TBDP: 8.0000 mg | ORAL_TABLET | Freq: Three times a day (TID) | ORAL | Status: DC | PRN

## 2012-08-18 MED ORDER — OXYCODONE-ACETAMINOPHEN 5-325 MG PO TABS: 1.0000 | ORAL_TABLET | ORAL | Status: DC | PRN

## 2012-08-18 MED ORDER — ONDANSETRON 8 MG PO TBDP
ORAL_TABLET | ORAL | Status: AC
  Administered 2012-08-18: 8 mg via ORAL
  Filled 2012-08-18: qty 1

## 2012-08-18 NOTE — ED Notes (Signed)
Pt reports abd pain last night after eating chinese food- today pain went into chest and throat- vomited x 1 upon arrival to ED

## 2012-08-18 NOTE — ED Provider Notes (Signed)
History  This chart was scribed for Hilario Quarry, MD, by Candelaria Stagers, ED Scribe. This patient was seen in room MH04/MH04 and the patient's care was started at 10:12 PM   CSN: [id number]  Arrival date & time 08/18/12  2111   First MD Initiated Contact with Patient 08/18/12 2205      Chief Complaint  Patient presents with  . Chest Pain  . Abdominal Pain    The history is provided by the patient. No language interpreter was used.   Christina [name] is a 52 y.o. female who presents to the Emergency Department complaining of abdominal pain hat started yesterday after she reports she ate chinese food and drank alcohol for the first time.  She is experiencing body aches, vomiting, and chills.  Pt has only vomited once and reports that after vomiting she felt better.  She has been drinking normally, but has not been eating.  Pt has h/o cholecystectomy and cesarean section.  Pt reports normal bowel movement today.  She she has had ill contacts.  Pt has h/o HTN.  She reports her periods are irregular.  She denies frequent urination.     Past Medical History  Diagnosis Date  . Hypertension     Past Surgical History  Procedure Laterality Date  . Cholecystectomy    . Cesarean section      No family history on file.  History  Substance Use Topics  . Smoking status: Never Smoker   . Smokeless tobacco: Never Used  . Alcohol Use: No    OB History   Grav Para Term Preterm Abortions TAB SAB Ect Mult Living                  Review of Systems  Constitutional: Positive for chills.  Gastrointestinal: Positive for nausea, vomiting and abdominal pain.  All other systems reviewed and are negative.    Allergies  Morphine and related  Home Medications   Current Outpatient Rx  Name  Route  Sig  Dispense  Refill  . Multiple Vitamin (MULTIVITAMIN) capsule   Oral   Take 1 capsule by mouth daily.         . diphenhydrAMINE (BENADRYL) 25 mg capsule   Oral   Take 25 mg by mouth  every 6 (six) hours as needed. For itching           BP 160/76  Pulse 93  Temp(Src) 99.6 F (37.6 C)  Resp 24  Ht 5\' 3"  (1.6 m)  Wt 182 lb (82.555 kg)  BMI 32.25 kg/m2  SpO2 100%  Physical Exam  Nursing note and vitals reviewed. Constitutional: She is oriented to person, place, and time. She appears well-developed and well-nourished. No distress.  HENT:  Head: Normocephalic and atraumatic.  Eyes: EOM are normal.  Neck: Neck supple. No tracheal deviation present.  Cardiovascular: Normal rate.   Pulmonary/Chest: Effort normal. No respiratory distress.  Musculoskeletal: Normal range of motion.  Neurological: She is alert and oriented to person, place, and time.  Skin: Skin is warm and dry.  Psychiatric: She has a normal mood and affect. Her behavior is normal.    ED Course  Procedures   DIAGNOSTIC STUDIES: Oxygen Saturation is 100% on room air, normal by my interpretation.    COORDINATION OF CARE:  10:16 PM Discussed course of care with pt which includes lab work and zofran.  Pt understands and agrees.   10:43 PM Recheck: Pt reports she is still experiencing abdominal pain.  She  reports the zofran eased nausea somewhat.  Advised pt to return if pain becomes worse.    Labs Reviewed - No data to display No results found.   No diagnosis found.    MDM  52 year old female comes in today with nausea, vomiting and stomach cramping. She has had headache and generalized muscle aches as well as chills. Her husband had a similar episode last week. She did be [place] food yesterday and drank alcohol for the first time. She thinks it may be related to one of these. She vomited just prior to coming in and seal states she feels somewhat better. Her abdomen is soft and nontender here on exam several times. Will give the patient a prescription for 6 Percocet and for Zofran. She's advised return if she is localizing pain or is worsening symptoms. She has been drinking liquids  throughout this without difficulty.   I personally performed the services described in this documentation, which was scribed in my presence. The recorded information has been reviewed and considered.     Hilario Quarry, MD 08/18/12 [phone]

## 2012-10-31 ENCOUNTER — Observation Stay (HOSPITAL_COMMUNITY)
Admission: EM | Admit: 2012-10-31 | Discharge: 2012-11-02 | Disposition: A | Discharge status: Home / Self Care | Payer: Managed Care, Other (non HMO) | Attending: Internal Medicine | Admitting: Internal Medicine

## 2012-10-31 ENCOUNTER — Encounter (HOSPITAL_COMMUNITY): Payer: Self-pay | Admitting: Emergency Medicine

## 2012-10-31 ENCOUNTER — Emergency Department (HOSPITAL_COMMUNITY): Payer: Managed Care, Other (non HMO)

## 2012-10-31 DIAGNOSIS — R0789 Other chest pain: Principal | ICD-10-CM | POA: Insufficient documentation

## 2012-10-31 DIAGNOSIS — I10 Essential (primary) hypertension: Secondary | ICD-10-CM | POA: Diagnosis present

## 2012-10-31 DIAGNOSIS — R1013 Epigastric pain: Secondary | ICD-10-CM | POA: Insufficient documentation

## 2012-10-31 DIAGNOSIS — E669 Obesity, unspecified: Secondary | ICD-10-CM | POA: Insufficient documentation

## 2012-10-31 DIAGNOSIS — R0602 Shortness of breath: Secondary | ICD-10-CM | POA: Insufficient documentation

## 2012-10-31 DIAGNOSIS — R079 Chest pain, unspecified: Secondary | ICD-10-CM | POA: Diagnosis present

## 2012-10-31 DIAGNOSIS — R7309 Other abnormal glucose: Secondary | ICD-10-CM | POA: Insufficient documentation

## 2012-10-31 DIAGNOSIS — K859 Acute pancreatitis without necrosis or infection, unspecified: Secondary | ICD-10-CM | POA: Diagnosis present

## 2012-10-31 DIAGNOSIS — Z6830 Body mass index (BMI) 30.0-30.9, adult: Secondary | ICD-10-CM | POA: Insufficient documentation

## 2012-10-31 LAB — CBC
HCT: 41.8 % (ref 36.0–46.0)
Hemoglobin: 14.2 g/dL (ref 12.0–15.0)
MCHC: 34 g/dL (ref 30.0–36.0)
MCV: 81.3 fL (ref 78.0–100.0)

## 2012-10-31 LAB — URINALYSIS, ROUTINE W REFLEX MICROSCOPIC
Bilirubin Urine: NEGATIVE
Glucose, UA: NEGATIVE mg/dL
Protein, ur: 100 mg/dL — AB

## 2012-10-31 LAB — URINE MICROSCOPIC-ADD ON

## 2012-10-31 MED ORDER — HYDROMORPHONE HCL PF 1 MG/ML IJ SOLN
1.0000 mg | Freq: Once | INTRAMUSCULAR | Status: AC
  Administered 2012-10-31: 1 mg via INTRAVENOUS
  Filled 2012-10-31: qty 1

## 2012-10-31 MED ORDER — SODIUM CHLORIDE 0.9 % IJ SOLN
10.0000 mL | INTRAMUSCULAR | Status: DC | PRN
  Administered 2012-10-31: 10 mL via INTRAVENOUS

## 2012-10-31 MED ORDER — NITROGLYCERIN 2 % TD OINT
1.0000 [in_us] | TOPICAL_OINTMENT | Freq: Once | TRANSDERMAL | Status: AC
  Administered 2012-10-31: 1 [in_us] via TOPICAL
  Filled 2012-10-31: qty 1

## 2012-10-31 NOTE — ED Notes (Signed)
Pt very rude and accusing me that i did not respect her.

## 2012-10-31 NOTE — ED Notes (Signed)
Pt brought to ED by EMS with left side chest pain and hypertensive.Pt recived nitroX 2 and asprin 324mg .

## 2012-10-31 NOTE — ED Notes (Signed)
Assisted pt to use the bedside comode. Pt very rude and saying she wants the nurse tech right know and as she is paying her insurance she wants things right away.

## 2012-10-31 NOTE — ED Provider Notes (Signed)
History     CSN: [id number]  Arrival date & time 10/31/12  2139   First MD Initiated Contact with Patient 10/31/12 2146      Chief Complaint  Patient presents with  . Chest Pain    (Consider location/radiation/quality/duration/timing/severity/associated sxs/prior treatment) HPI  52 year old [place] female presents to the emergency department chief complaint of chest pain. She is a past medical history of obesity and hypertension. Non smoker, non diabetic. Father and grandmother died of early MI (patient unsure if in  100s or 35s).about 1 hour prior to arrival patient began having severe substernal and left-sided chest pain.  She describes it as heavy pressure.  Patient states that it came on suddenly.  He had associated dyspnea but denies any nausea, vomiting, diaphoresis, radiation to the left arm or jaw or any paresthesia of the hands.  Patient states she has lost weight and her blood pressure was normal the last time she was seeing her physician.  She states that she stopped taking her blood pressure medication because she felt she no longer needed to take it.    Denies fevers, chills, myalgias, arthralgias.  Denies dysuria, flank pain, suprapubic pain, frequency, urgency, or hematuria. Denies headaches, light headedness, weakness, visual disturbances. Denies abdominal pain, nausea, vomiting, diarrhea or constipation.  She denies unilateral leg swelling, calf pain, history of DVT or pulmonary embolism.   Past Medical History  Diagnosis Date  . Hypertension     Past Surgical History  Procedure Laterality Date  . Cholecystectomy    . Cesarean section      No family history on file.  History  Substance Use Topics  . Smoking status: Never Smoker   . Smokeless tobacco: Never Used  . Alcohol Use: No    OB History   Grav Para Term Preterm Abortions TAB SAB Ect Mult Living                  Review of Systems Ten systems reviewed and are negative for acute change, except as  noted in the HPI.   Allergies  Morphine and related  Home Medications   Current Outpatient Rx  Name  Route  Sig  Dispense  Refill  . diphenhydrAMINE (BENADRYL) 25 mg capsule   Oral   Take 25 mg by mouth every 6 (six) hours as needed. For itching         . Multiple Vitamin (MULTIVITAMIN) capsule   Oral   Take 1 capsule by mouth daily.         . ondansetron (ZOFRAN ODT) 8 MG disintegrating tablet   Oral   Take 1 tablet (8 mg total) by mouth every 8 (eight) hours as needed for nausea.   20 tablet   0   . oxyCODONE-acetaminophen (PERCOCET/ROXICET) 5-325 MG per tablet   Oral   Take 1 tablet by mouth every 4 (four) hours as needed for pain.   6 tablet   0     BP 184/83  Pulse 78  Temp(Src) 98.1 F (36.7 C) (Oral)  Resp 19  SpO2 100%  Physical Exam Physical Exam  Nursing note and vitals reviewed. Constitutional: She is oriented to person, place, and time. She appears well-developed and well-nourished.  Patient appears uncomfortable.  She keeps clutching her chest with positive Levine sign. HENT:  Head: Normocephalic and atraumatic.  Eyes: Conjunctivae normal and EOM are normal. Pupils are equal, round, and reactive to light. No scleral icterus.  Neck: Normal range of motion.  Cardiovascular: Normal rate,  regular rhythm and normal heart sounds.  Exam reveals no gallop and no friction rub.   No murmur heard. Pulmonary/Chest: Effort normal and breath sounds normal. No respiratory distress.  Abdominal: Soft. Bowel sounds are normal. She exhibits no distension and no mass. There is no tenderness. There is no guarding.  Neurological: She is alert and oriented to person, place, and time.  Skin: Skin is warm and dry. She is not diaphoretic.    ED Course  Procedures (including critical care time)  Labs Reviewed  URINALYSIS, ROUTINE W REFLEX MICROSCOPIC - Abnormal; Notable for the following:    Hgb urine dipstick TRACE (*)    Protein, ur 100 (*)    All other components  within normal limits  URINE MICROSCOPIC-ADD ON - Abnormal; Notable for the following:    Squamous Epithelial / LPF FEW (*)    All other components within normal limits  BASIC METABOLIC PANEL  CBC   No results found.   1. HYPERTENSION   2. Chest pain      Date: 10/31/2012  Rate: 92  Rhythm: normal sinus rhythm  QRS Axis: normal  Intervals: normal  ST/T Wave abnormalities: normal  Conduction Disutrbances:right bundle branch block and ventricular bigeminy  Narrative Interpretation:   Old EKG Reviewed: changes noted    MDM  11:05 PM BP 184/83  Pulse 78  Temp(Src) 98.1 F (36.7 C) (Oral)  Resp 19  SpO2 100% Patient with severe cp. Her risk factors for cardiac disease appear. Will treat with nitropaste and pain meds.    12:41 AM Patient's chest pain is resolved. Her bigeminy has also resolved. Negative troponin. I have discussed the case with my attending physician and we both feel she needs admission for cp rule out.  She hppears to have a previous admission, but i do not see that she had stress testing or echocardiogram.     12:52 AM Patient will be admitted for chest pain rule out. By dr. Lars Masson.     Arthor Captain, PA-C 11/01/12 [phone]

## 2012-11-01 ENCOUNTER — Encounter (HOSPITAL_COMMUNITY): Payer: Self-pay | Admitting: Internal Medicine

## 2012-11-01 DIAGNOSIS — I10 Essential (primary) hypertension: Secondary | ICD-10-CM | POA: Diagnosis present

## 2012-11-01 DIAGNOSIS — R079 Chest pain, unspecified: Secondary | ICD-10-CM

## 2012-11-01 LAB — POCT I-STAT TROPONIN I: Troponin i, poc: 0 ng/mL (ref 0.00–0.08)

## 2012-11-01 LAB — BASIC METABOLIC PANEL
BUN: 12 mg/dL (ref 6–23)
CO2: 27 mEq/L (ref 19–32)
Calcium: 9.1 mg/dL (ref 8.4–10.5)
Chloride: 102 mEq/L (ref 96–112)
Creatinine, Ser: 0.59 mg/dL (ref 0.50–1.10)
GFR calc Af Amer: >90 mL/min (ref >90)
GFR calc Af Amer: >90 mL/min (ref >90)
GFR calc non Af Amer: >90 mL/min (ref >90)
Glucose, Bld: 100 mg/dL — ABNORMAL HIGH (ref 70–99)
Potassium: 3.6 mEq/L (ref 3.5–5.1)
Sodium: 140 mEq/L (ref 135–145)

## 2012-11-01 LAB — LIPID PANEL
LDL Cholesterol: 145 mg/dL — ABNORMAL HIGH (ref 0–99)
Total CHOL/HDL Ratio: 3.3 RATIO

## 2012-11-01 LAB — CBC
Platelets: 272 10*3/uL (ref 150–400)
RBC: 4.83 MIL/uL (ref 3.87–5.11)
WBC: 6.5 10*3/uL (ref 4.0–10.5)

## 2012-11-01 LAB — COMPREHENSIVE METABOLIC PANEL
ALT: 434 U/L — ABNORMAL HIGH (ref 0–35)
Albumin: 3.3 g/dL — ABNORMAL LOW (ref 3.5–5.2)
Alkaline Phosphatase: 152 U/L — ABNORMAL HIGH (ref 39–117)
BUN: 16 mg/dL (ref 6–23)
Chloride: 106 mEq/L (ref 96–112)
GFR calc Af Amer: >90 mL/min (ref >90)
Glucose, Bld: 105 mg/dL — ABNORMAL HIGH (ref 70–99)
Potassium: 4.1 mEq/L (ref 3.5–5.1)
Total Bilirubin: 0.6 mg/dL (ref 0.3–1.2)

## 2012-11-01 LAB — HEPATIC FUNCTION PANEL
ALT: 483 U/L — ABNORMAL HIGH (ref 0–35)
AST: 747 U/L — ABNORMAL HIGH (ref 0–37)
Total Protein: 7.3 g/dL (ref 6.0–8.3)

## 2012-11-01 LAB — TROPONIN I
Troponin I: <0.3 ng/mL (ref <0.30)
Troponin I: <0.3 ng/mL (ref <0.30)

## 2012-11-01 LAB — LIPASE, BLOOD: Lipase: 1442 U/L — ABNORMAL HIGH (ref 11–59)

## 2012-11-01 MED ORDER — ONDANSETRON HCL 4 MG PO TABS: 4.0000 mg | ORAL_TABLET | Freq: Four times a day (QID) | ORAL | Status: DC | PRN

## 2012-11-01 MED ORDER — SODIUM CHLORIDE 0.9 % IV SOLN: INTRAVENOUS | Status: DC

## 2012-11-01 MED ORDER — HYDRALAZINE HCL 20 MG/ML IJ SOLN: 10.0000 mg | INTRAMUSCULAR | Status: DC | PRN

## 2012-11-01 MED ORDER — NITROGLYCERIN 0.4 MG/HR TD PT24
0.4000 mg | MEDICATED_PATCH | Freq: Every day | TRANSDERMAL | Status: DC
  Administered 2012-11-01: 0.4 mg via TRANSDERMAL
  Filled 2012-11-01 (×2): qty 1

## 2012-11-01 MED ORDER — HYDROMORPHONE HCL PF 1 MG/ML IJ SOLN: 1.0000 mg | INTRAMUSCULAR | Status: DC | PRN

## 2012-11-01 MED ORDER — ONDANSETRON HCL 4 MG/2ML IJ SOLN: 4.0000 mg | Freq: Three times a day (TID) | INTRAMUSCULAR | Status: DC | PRN

## 2012-11-01 MED ORDER — ASPIRIN EC 325 MG PO TBEC
325.0000 mg | DELAYED_RELEASE_TABLET | Freq: Every day | ORAL | Status: DC
  Administered 2012-11-01 – 2012-11-02 (×2): 325 mg via ORAL
  Filled 2012-11-01 (×2): qty 1

## 2012-11-01 MED ORDER — SODIUM CHLORIDE 0.9 % IJ SOLN: 3.0000 mL | Freq: Two times a day (BID) | INTRAMUSCULAR | Status: DC

## 2012-11-01 MED ORDER — ONDANSETRON HCL 4 MG/2ML IJ SOLN: 4.0000 mg | Freq: Four times a day (QID) | INTRAMUSCULAR | Status: DC | PRN

## 2012-11-01 MED ORDER — ENOXAPARIN SODIUM 40 MG/0.4ML ~~LOC~~ SOLN
40.0000 mg | SUBCUTANEOUS | Status: DC
  Administered 2012-11-01: 40 mg via SUBCUTANEOUS
  Filled 2012-11-01 (×2): qty 0.4

## 2012-11-01 MED ORDER — ACETAMINOPHEN 325 MG PO TABS
650.0000 mg | ORAL_TABLET | Freq: Four times a day (QID) | ORAL | Status: DC | PRN
  Administered 2012-11-01 – 2012-11-02 (×4): 650 mg via ORAL
  Filled 2012-11-01 (×4): qty 2

## 2012-11-01 MED ORDER — ASPIRIN EC 81 MG PO TBEC: 81.0000 mg | DELAYED_RELEASE_TABLET | Freq: Every day | ORAL | Status: DC

## 2012-11-01 MED ORDER — ACETAMINOPHEN 650 MG RE SUPP: 650.0000 mg | Freq: Four times a day (QID) | RECTAL | Status: DC | PRN

## 2012-11-01 NOTE — Care Management Note (Unsigned)
    Page 1 of 1   11/01/2012     3:38:15 PM   CARE MANAGEMENT NOTE 11/01/2012  Patient:  Christina [name],Christina [name]   Account Number:  [id number]  Date Initiated:  11/01/2012  Documentation initiated by:  Talajah Slimp  Subjective/Objective Assessment:   PT ADM ON 10/31/12 WITH CHEST PAIN.  PTA, PT INDEPENDENT OF ADLS.     Action/Plan:   WILL FOLLOW FOR HOME NEEDS AS PT PROGRESSES.   Anticipated DC Date:  11/01/2012   Anticipated DC Plan:  HOME/SELF CARE      DC Planning Services  CM consult      Choice offered to / List presented to:             Status of service:  In process, will continue to follow Medicare Important Message given?   (If response is "NO", the following Medicare IM given date fields will be blank) Date Medicare IM given:   Date Additional Medicare IM given:    Discharge Disposition:    Per UR Regulation:  Reviewed for med. necessity/level of care/duration of stay  If discussed at Long Length of Stay Meetings, dates discussed:    Comments:

## 2012-11-01 NOTE — ED Notes (Signed)
Istat Troponin 0.00

## 2012-11-01 NOTE — H&P (Signed)
[facility] History and Physical  Christina [name] WUJ:[id number] DOB: [birth year] DOA: 10/31/2012  Referring physician: ER physician. PCP: Oliver Barre, MD  Specialists: None.  Chief Complaint: Chest pain.  HPI: Christina [name] is a 52 y.o. female started developing chest pain while she was at [employer]. The chest pain as left anterior chest wall pressure-like associated with shortness of breath. Denies any nausea vomiting diaphoresis palpitations. Since the pain is persistent EMS was called and patient was brought to the ER. In the ER patient was found to have significantly elevated blood pressure and patient was given nitroglycerin sublingual after which patient's blood pressure improved and patient's chest pain resolved. At this time EKG shows sinus rhythm with PVCs. Cardiac enzymes and chest x-ray are unremarkable and patient will be admitted for further management. Patient states that she was started on antihypertensives last year which was eventually discontinued 8 months ago as her blood pressures were normal. She is occasionally taking Mobic for low back pain.  Review of Systems: As presented in the history of presenting illness, rest negative.  Past Medical History  Diagnosis Date  . Hypertension    Past Surgical History  Procedure Laterality Date  . Cholecystectomy    . Cesarean section     Social History:  reports that she has never smoked. She has never used smokeless tobacco. She reports that she does not drink alcohol or use illicit drugs. Lives at home. where does patient live-- Can do ADLs. Can patient participate in ADLs?  Allergies  Allergen Reactions  . Morphine And Related Itching    Family History  Problem Relation Age of Onset  . CAD Father   . Hypertension Father       Prior to Admission medications   Medication Sig Start Date End Date Taking? Authorizing Provider  diphenhydrAMINE (BENADRYL) 25 mg capsule Take 25 mg by mouth every 6 (six) hours as  needed. For itching   Yes Historical Provider, MD  Multiple Vitamin (MULTIVITAMIN) capsule Take 1 capsule by mouth daily.   Yes Historical Provider, MD   Physical Exam: Filed Vitals:   10/31/12 2156 10/31/12 2200 11/01/12 0130  BP: 184/83 177/85 145/77  Pulse: 78 39 77  Temp: 98.1 F (36.7 C)  98 F (36.7 C)  TempSrc: Oral  Oral  Resp: 19 16 23   SpO2: 100% 100% 100%     General:  Well-developed and nourished.  Eyes: Anicteric no pallor.  ENT: No discharge from the ears eyes nose mouth.  Neck: No mass felt.  Cardiovascular: S1-S2 heard.  Respiratory: No rhonchi no crepitations.  Abdomen: Soft nontender bowel sounds present.  Skin: No rash.  Musculoskeletal: No edema.  Psychiatric: Appears normal.  Neurologic: Alert oriented to time place and person. Moves all extremities.  Labs on Admission:  Basic Metabolic Panel:  Recent Labs Lab 10/31/12 2325  NA 140  K 3.6  CL 102  CO2 27  GLUCOSE 100*  BUN 12  CREATININE 0.59  CALCIUM 9.6   Liver Function Tests: No results found for this basename: AST, ALT, ALKPHOS, BILITOT, PROT, ALBUMIN,  in the last 168 hours No results found for this basename: LIPASE, AMYLASE,  in the last 168 hours No results found for this basename: AMMONIA,  in the last 168 hours CBC:  Recent Labs Lab 10/31/12 2325  WBC 8.7  HGB 14.2  HCT 41.8  MCV 81.3  PLT 320   Cardiac Enzymes: No results found for this basename: CKTOTAL, CKMB, CKMBINDEX, TROPONINI,  in the last  168 hours  BNP (last 3 results) No results found for this basename: PROBNP,  in the last 8760 hours CBG: No results found for this basename: GLUCAP,  in the last 168 hours  Radiological Exams on Admission: Dg Chest 2 View  10/31/2012   *RADIOLOGY REPORT*  Clinical Data: Chest pain.  CHEST - 2 VIEW  Comparison: 06/29/2010.  Findings: The cardiac silhouette, mediastinal and hilar contours are within normal limits and stable.  The lungs are clear.  No pleural effusion.   The bony thorax is intact.  IMPRESSION: No acute cardiopulmonary findings.   Original Report Authenticated By: Rudie Meyer, M.D.    EKG: Independently reviewed. Normal sinus rhythm with PVCs. Right bundle branch block.  Assessment/Plan Principal Problem:   Chest pain Active Problems:   Hypertension, accelerated   1. Chest pain - presently chest pain-free. Cycle cardiac markers. Nitroglycerin patch. Check 2-D echo. Aspirin. 2. Accelerated hypertension - blood pressure improved with nitroglycerin sublingual. At this time I have placed patient on nitroglycerin patch and when necessary IV hydralazine for systolic blood pressure 160. If patient's blood pressure tends to remain high patient has restarted on different antihypertensives.  Check urine drug screen.    Code Status: Full code.  Family Communication: None.  Disposition Plan: Admit for observation.    Leona Pressly N. [facility] Pager [phone].  If 7PM-7AM, please contact night-coverage [email or web address] Password [facility] 11/01/2012, 1:39 AM

## 2012-11-01 NOTE — Progress Notes (Signed)
Pt seen and examined, admitted this am per Dr.K with Atypical chest and abdominal pain -resolved since -EKG, cardiac enzymes negative, ECHO pending -check LFTS, Lipase -supportive care  Zannie Cove, MD [phone]

## 2012-11-01 NOTE — Progress Notes (Signed)
Utilization Review Completed.Christina [name] T5/22/2014  

## 2012-11-01 NOTE — ED Provider Notes (Signed)
Medical screening examination/treatment/procedure(s) were performed by non-physician practitioner and as supervising physician I was immediately available for consultation/collaboration.    Vida Roller, MD 11/01/12 1350

## 2012-11-02 ENCOUNTER — Observation Stay (HOSPITAL_COMMUNITY): Payer: Managed Care, Other (non HMO)

## 2012-11-02 DIAGNOSIS — R079 Chest pain, unspecified: Secondary | ICD-10-CM

## 2012-11-02 DIAGNOSIS — I10 Essential (primary) hypertension: Secondary | ICD-10-CM

## 2012-11-02 DIAGNOSIS — K859 Acute pancreatitis without necrosis or infection, unspecified: Secondary | ICD-10-CM | POA: Diagnosis present

## 2012-11-02 LAB — COMPREHENSIVE METABOLIC PANEL
ALT: 289 U/L — ABNORMAL HIGH (ref 0–35)
AST: 176 U/L — ABNORMAL HIGH (ref 0–37)
Alkaline Phosphatase: 131 U/L — ABNORMAL HIGH (ref 39–117)
GFR calc Af Amer: >90 mL/min (ref >90)
Glucose, Bld: 101 mg/dL — ABNORMAL HIGH (ref 70–99)
Potassium: 4 mEq/L (ref 3.5–5.1)
Sodium: 140 mEq/L (ref 135–145)
Total Protein: 6.3 g/dL (ref 6.0–8.3)

## 2012-11-02 MED ORDER — IBUPROFEN 600 MG PO TABS
600.0000 mg | ORAL_TABLET | Freq: Once | ORAL | Status: DC
  Filled 2012-11-02: qty 1

## 2012-11-02 MED ORDER — BUTALBITAL-APAP-CAFFEINE 50-325-40 MG PO TABS
1.0000 | ORAL_TABLET | Freq: Once | ORAL | Status: AC
  Administered 2012-11-02: 1 via ORAL
  Filled 2012-11-02: qty 1

## 2012-11-02 NOTE — Progress Notes (Signed)
Patient states headache is now relieved. Discharge instructions along with med list provided. Iv d/c'd with catheter intact. Patient transported via wheelchair to lobby per unit 2000 staff. Belongings with patient and family. Mamie Levers

## 2012-11-06 NOTE — Discharge Summary (Signed)
Physician Discharge Summary  [name] ZOX:[id number] DOB: Oct 31, 1960 DOA: 10/31/2012  PCP: Oliver Barre, MD  Admit date: 10/31/2012 Discharge date: 11/02/2012  Time spent: 50 minutes  Recommendations for Outpatient Follow-up:  1. PCP in 1 week 2. [facility] in 3 weeks  Discharge Diagnoses:   Atypical chest/abdominal pain  Hypertension, accelerated  Pancreatitis, acute  Obesity  Borderline DM  Discharge Condition: stable  Diet recommendation: low Na, diabetic  Filed Weights   11/01/12 0230  Weight: 82.101 kg (181 lb)    History of present illness:  Christina [name] is a 52 y.o. female started developing chest pain while she was at [employer]. The chest pain as left anterior chest wall pressure-like associated with shortness of breath. Denies any nausea vomiting diaphoresis palpitations. Since the pain is persistent EMS was called and patient was brought to the ER. In the ER patient was found to have significantly elevated blood pressure and patient was given nitroglycerin sublingual after which patient's blood pressure improved and patient's chest pain resolved. At this time EKG shows sinus rhythm with PVCs. Cardiac enzymes and chest x-ray are unremarkable and patient will be admitted for further management. Patient states that she was started on antihypertensives last year which was eventually discontinued 8 months ago as her blood pressures were normal. She is occasionally taking Mobic for low back pain.   Hospital Course:  Atypical lower chest/epigastric pain, pain resolved by next am -EKG, cardiac enzymes negative,  -Subsequent labs revealed Elevated lipase, AST/ALT concerning for pancreatitis possibly gall stone -however symptoms completely resolved by next day and labs with Lipase/LFTS trending down -RUQ USG was negative for choledocholithiasis, she is s/p cholecystectomy. -Her diet was advanced to bland diet at discharge. -TG level normal -She is advised to FU with GI  since the etiology of her pancreatitis is not clear.    Discharge Exam: Filed Vitals:   11/01/12 1900 11/01/12 2016 11/02/12 0522 11/02/12 1409  BP: 135/80 130/76 150/74 138/87  Pulse: 80 64 56 60  Temp: 99.3 F (37.4 C) 98.8 F (37.1 C) 97.4 F (36.3 C) 97.6 F (36.4 C)  TempSrc: Oral Oral Oral Oral  Resp: 18 18 18 18   Height:      Weight:      SpO2: 95% 100% 99% 99%    General: AAOx3 Cardiovascular: S1s2/RRR Respiratory: CTAB  Discharge Instructions  Discharge Orders   Future Orders Complete By Expires     Discharge instructions  As directed     Comments:      Bland diet for next 2-3 days Carb modified diet, weight loss    Increase activity slowly  As directed         Medication List    TAKE these medications       aspirin EC 81 MG tablet  Take 1 tablet (81 mg total) by mouth daily.     diphenhydrAMINE 25 mg capsule  Commonly known as:  BENADRYL  Take 25 mg by mouth every 6 (six) hours as needed. For itching     multivitamin capsule  Take 1 capsule by mouth daily.       Allergies  Allergen Reactions  . Morphine And Related Itching       Follow-up Information   Follow up with [facility], MD. Schedule an appointment as soon as possible for a visit in 1 week.   Contact information:   [address] [place] [address] [phone]       Follow up with Lina Sar, MD.  Schedule an appointment as soon as possible for a visit in 3 weeks. [facility] Follow [facility] for Pancreatitis )    Contact information:   [address] [place] [address] [phone]        The results of significant diagnostics from this hospitalization (including imaging, microbiology, ancillary and laboratory) are listed below for reference.    Significant Diagnostic Studies: Dg Chest 2 View  10/31/2012   *RADIOLOGY REPORT*  Clinical Data: Chest pain.  CHEST - 2 VIEW  Comparison: 06/29/2010.  Findings: The cardiac silhouette, mediastinal and hilar contours are  within normal limits and stable.  The lungs are clear.  No pleural effusion.  The bony thorax is intact.  IMPRESSION: No acute cardiopulmonary findings.   Original Report Authenticated By: Rudie Meyer, M.D.   US Abdomen Complete  11/02/2012   *RADIOLOGY REPORT*  Clinical Data:  Elevated LFTs  COMPLETE ABDOMINAL ULTRASOUND  Comparison:  08/10/2011.  Findings:  Gallbladder:  Surgically removed  Common bile duct:  3.8 mm and within normal limits.  Liver:  No focal lesion identified.  Within normal limits in parenchymal echogenicity.  IVC:  Appears normal.  Pancreas:  Within normal limits although the tail is not well visualized due to overlying bowel gas.  Spleen:  6.4 cm.  No focal mass lesion is seen.  Right Kidney:  10.9 cm in length. An 8 mm hyperechoic area is noted in the cortex of the right kidney.  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  Left Kidney:  0.5 cm in length  Abdominal aorta:  No aneurysm identified.  IMPRESSION: No acute abnormality is noted.   Original Report Authenticated By: Alcide Clever, M.D.    Microbiology: No results found for this or any previous visit (from the past 240 hour(s)).   Labs: Basic Metabolic Panel:  Recent Labs Lab 10/31/12 2325 11/01/12 0930 11/01/12 1440 11/02/12 0450  NA 140 140 140 140  K 3.6 3.4* 4.1 4.0  CL 102 102 106 107  CO2 27 22 22 23   GLUCOSE 100* 113* 105* 101*  BUN 12 15 16 17   CREATININE 0.59 0.62 0.75 0.59  CALCIUM 9.6 9.1 8.8 8.8   Liver Function Tests:  Recent Labs Lab 11/01/12 0930 11/01/12 1440 11/02/12 0450  AST 747* 470* 176*  ALT 483* 434* 289*  ALKPHOS 156* 152* 131*  BILITOT 1.0 0.6 0.4  PROT 7.3 6.8 6.3  ALBUMIN 3.6 3.3* 3.0*    Recent Labs Lab 11/01/12 0930 11/01/12 1440 11/02/12 0450  LIPASE >3000* 1442* 90*   No results found for this basename: AMMONIA,  in the last 168 hours CBC:  Recent Labs Lab 10/31/12 2325 11/01/12 0930  WBC 8.7 6.5   HGB 14.2 12.7  HCT 41.8 39.7  MCV 81.3 82.2  PLT 320 272   Cardiac Enzymes:  Recent Labs Lab 11/01/12 0832 11/01/12 1440  TROPONINI <0.30 <0.30   BNP: BNP (last 3 results) No results found for this basename: PROBNP,  in the last 8760 hours CBG: No results found for this basename: GLUCAP,  in the last 168 hours     Signed:  Adaisha Campise  [facility] 11/06/2012, 8:09 PM

## 2013-03-03 ENCOUNTER — Emergency Department (HOSPITAL_BASED_OUTPATIENT_CLINIC_OR_DEPARTMENT_OTHER)
Admission: EM | Admit: 2013-03-03 | Discharge: 2013-03-03 | Disposition: A | Discharge status: Home / Self Care | Payer: Managed Care, Other (non HMO) | Attending: Emergency Medicine | Admitting: Emergency Medicine

## 2013-03-03 ENCOUNTER — Encounter (HOSPITAL_BASED_OUTPATIENT_CLINIC_OR_DEPARTMENT_OTHER): Payer: Self-pay

## 2013-03-03 DIAGNOSIS — T2220XA Burn of second degree of shoulder and upper limb, except wrist and hand, unspecified site, initial encounter: Secondary | ICD-10-CM | POA: Insufficient documentation

## 2013-03-03 DIAGNOSIS — Y929 Unspecified place or not applicable: Secondary | ICD-10-CM | POA: Insufficient documentation

## 2013-03-03 DIAGNOSIS — Y939 Activity, unspecified: Secondary | ICD-10-CM | POA: Insufficient documentation

## 2013-03-03 DIAGNOSIS — X19XXXA Contact with other heat and hot substances, initial encounter: Secondary | ICD-10-CM | POA: Insufficient documentation

## 2013-03-03 DIAGNOSIS — I10 Essential (primary) hypertension: Secondary | ICD-10-CM | POA: Insufficient documentation

## 2013-03-03 MED ORDER — HYDROCODONE-ACETAMINOPHEN 5-325 MG PO TABS: 2.0000 | ORAL_TABLET | ORAL | Status: DC | PRN

## 2013-03-03 MED ORDER — IBUPROFEN 800 MG PO TABS
800.0000 mg | ORAL_TABLET | Freq: Once | ORAL | Status: AC
  Administered 2013-03-03: 800 mg via ORAL

## 2013-03-03 MED ORDER — IBUPROFEN 800 MG PO TABS
ORAL_TABLET | ORAL | Status: AC
  Administered 2013-03-03: 800 mg via ORAL
  Filled 2013-03-03: qty 1

## 2013-03-03 MED ORDER — SILVER SULFADIAZINE 1 % EX CREA
TOPICAL_CREAM | Freq: Once | CUTANEOUS | Status: AC
  Administered 2013-03-03: 1 via TOPICAL
  Filled 2013-03-03: qty 85

## 2013-03-03 MED ORDER — HYDROCODONE-ACETAMINOPHEN 5-325 MG PO TABS: 2.0000 | ORAL_TABLET | Freq: Once | ORAL | Status: DC

## 2013-03-03 NOTE — ED Notes (Signed)
Patient here with burn to right arm after spilling oatmeal on arm, also complains of burning to right nipple area. No blistering noted, only redness
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
## 2013-03-04 NOTE — ED Provider Notes (Signed)
Medical screening examination/treatment/procedure(s) were performed by non-physician practitioner and as supervising physician I was immediately available for consultation/collaboration.  Shelda Jakes, MD 03/04/13 [phone]

## 2013-06-26 ENCOUNTER — Emergency Department (HOSPITAL_BASED_OUTPATIENT_CLINIC_OR_DEPARTMENT_OTHER)
Admission: EM | Admit: 2013-06-26 | Discharge: 2013-06-26 | Disposition: A | Discharge status: Home / Self Care | Payer: BC Managed Care – PPO | Attending: Emergency Medicine | Admitting: Emergency Medicine

## 2013-06-26 ENCOUNTER — Emergency Department (HOSPITAL_BASED_OUTPATIENT_CLINIC_OR_DEPARTMENT_OTHER): Payer: BC Managed Care – PPO

## 2013-06-26 ENCOUNTER — Encounter (HOSPITAL_BASED_OUTPATIENT_CLINIC_OR_DEPARTMENT_OTHER): Payer: Self-pay | Admitting: Emergency Medicine

## 2013-06-26 DIAGNOSIS — I10 Essential (primary) hypertension: Secondary | ICD-10-CM | POA: Insufficient documentation

## 2013-06-26 DIAGNOSIS — S9030XA Contusion of unspecified foot, initial encounter: Secondary | ICD-10-CM | POA: Insufficient documentation

## 2013-06-26 DIAGNOSIS — Y929 Unspecified place or not applicable: Secondary | ICD-10-CM | POA: Insufficient documentation

## 2013-06-26 DIAGNOSIS — S9031XA Contusion of right foot, initial encounter: Secondary | ICD-10-CM

## 2013-06-26 DIAGNOSIS — Y939 Activity, unspecified: Secondary | ICD-10-CM | POA: Insufficient documentation

## 2013-06-26 DIAGNOSIS — IMO0002 Reserved for concepts with insufficient information to code with codable children: Secondary | ICD-10-CM | POA: Insufficient documentation

## 2013-06-26 MED ORDER — HYDROCODONE-ACETAMINOPHEN 5-325 MG PO TABS: 2.0000 | ORAL_TABLET | ORAL | Status: DC | PRN

## 2013-06-26 NOTE — ED Provider Notes (Signed)
CSN: [id number]     Arrival date & time 06/26/13  1638 History   First MD Initiated Contact with Patient 06/26/13 1727     Chief Complaint  Patient presents with  . Toe Injury   (Consider location/radiation/quality/duration/timing/severity/associated sxs/prior Treatment) Patient is a 53 y.o. female presenting with toe pain. The history is provided by the patient. No language interpreter was used.  Toe Pain This is a new problem. The current episode started today. The problem occurs constantly. The problem has been gradually worsening. Associated symptoms include joint swelling and myalgias. Nothing aggravates the symptoms. She has tried nothing for the symptoms. The treatment provided moderate relief.    Past Medical History  Diagnosis Date  . Hypertension    Past Surgical History  Procedure Laterality Date  . Cholecystectomy    . Cesarean section     Family History  Problem Relation Age of Onset  . CAD Father   . Hypertension Father    History  Substance Use Topics  . Smoking status: Never Smoker   . Smokeless tobacco: Never Used  . Alcohol Use: No   OB History   Grav Para Term Preterm Abortions TAB SAB Ect Mult Living                 Review of Systems  Musculoskeletal: Positive for joint swelling and myalgias.  All other systems reviewed and are negative.    Allergies  Morphine and related  Home Medications   Current Outpatient Rx  Name  Route  Sig  Dispense  Refill  . HYDROcodone-acetaminophen (NORCO/VICODIN) 5-325 MG per tablet   Oral   Take 2 tablets by mouth every 4 (four) hours as needed.   20 tablet   0    BP 148/75  Pulse 71  Temp(Src) 98.6 F (37 C) (Oral)  Resp 18  Ht 5\' 5"  (1.651 m)  Wt 196 lb (88.905 kg)  BMI 32.62 kg/m2  SpO2 99% Physical Exam  Nursing note and vitals reviewed. Constitutional: She is oriented to person, place, and time. She appears well-developed and well-nourished.  Musculoskeletal: She exhibits tenderness.   Bruised swollen right foot   Neurological: She is alert and oriented to person, place, and time.  Skin: Skin is warm.  Psychiatric: She has a normal mood and affect.    ED Course  Procedures (including critical care time) Labs Review Labs Reviewed - No data to display Imaging Review Dg Foot Complete Right  06/26/2013   CLINICAL DATA:  Pain post blunt trauma  EXAM: RIGHT FOOT COMPLETE - 3+ VIEW  COMPARISON:  None.  FINDINGS: There is no evidence of fracture or dislocation. There is no evidence of arthropathy or other focal bone abnormality. Soft tissues are unremarkable.  IMPRESSION: Negative.   Electronically Signed   By: Oley Balmaniel  Hassell M.D.   On: 06/26/2013 17:39    EKG Interpretation   None       MDM  No diagnosis found. Pt placed in buddy tape and post op shoe,   Xray reviewed, no displaced fracture.   Pt given hydrocodone for pain.   I advised pt to see Dr. Pearletha Forgehudnall for recheck in 1 week if pain persist    Elson AreasLeslie K Sofia, [place] JerseyPA-C 06/26/13 [phone]
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
## 2013-06-26 NOTE — Discharge Instructions (Signed)
Foot Contusion °A foot contusion is a deep bruise to the foot. Contusions are the result of an injury that caused bleeding under the skin. The contusion may turn blue, purple, or yellow. Minor injuries will give you a painless contusion, but more severe contusions may stay painful and swollen for a few weeks. °CAUSES  °A foot contusion comes from a direct blow to that area, such as a heavy object falling on the foot. °SYMPTOMS  °· Swelling of the foot. °· Discoloration of the foot. °· Tenderness or soreness of the foot. °DIAGNOSIS  °You will have a physical exam and will be asked about your history. You may need an X-ray of your foot to look for a broken bone (fracture).  °TREATMENT  °An elastic wrap may be recommended to support your foot. Resting, elevating, and applying cold compresses to your foot are often the best treatments for a foot contusion. Over-the-counter medicines may also be recommended for pain control. °HOME CARE INSTRUCTIONS  °· Put ice on the injured area. °· Put ice in a plastic bag. °· Place a towel between your skin and the bag. °· Leave the ice on for 15-20 minutes, 03-04 times a day. °· Only take over-the-counter or prescription medicines for pain, discomfort, or fever as directed by your caregiver. °· If told, use an elastic wrap as directed. This can help reduce swelling. You may remove the wrap for sleeping, showering, and bathing. If your toes become numb, cold, or blue, take the wrap off and reapply it more loosely. °· Elevate your foot with pillows to reduce swelling. °· Try to avoid standing or walking while the foot is painful. Do not resume use until instructed by your caregiver. Then, begin use gradually. If pain develops, decrease use. Gradually increase activities that do not cause discomfort until you have normal use of your foot. °· See your caregiver as directed. It is very important to keep all follow-up appointments in order to avoid any lasting problems with your foot,  including long-term (chronic) pain. °SEEK IMMEDIATE MEDICAL CARE IF:  °· You have increased redness, swelling, or pain in your foot. °· Your swelling or pain is not relieved with medicines. °· You have loss of feeling in your foot or are unable to move your toes. °· Your foot turns cold or blue. °· You have pain when you move your toes. °· Your foot becomes warm to the touch. °· Your contusion does not improve in 2 days. °MAKE SURE YOU:  °· Understand these instructions. °· Will watch your condition. °· Will get help right away if you are not doing well or get worse. °Document Released: 03/21/2006 Document Revised: 11/29/2011 Document Reviewed: 05/03/2011 °ExitCare® Patient Information ©2014 [employer]. ° °

## 2013-06-27 NOTE — ED Provider Notes (Signed)
Medical screening examination/treatment/procedure(s) were performed by non-physician practitioner and as supervising physician I was immediately available for consultation/collaboration.  EKG Interpretation   None        Juletta Berhe, MD 06/27/13 0016 

## 2013-07-05 ENCOUNTER — Encounter: Payer: Self-pay | Admitting: Family Medicine

## 2013-07-05 ENCOUNTER — Ambulatory Visit (INDEPENDENT_AMBULATORY_CARE_PROVIDER_SITE_OTHER): Payer: BC Managed Care – PPO | Admitting: Family Medicine

## 2013-07-05 VITALS — BP 173/94 | HR 70 | Ht 65.0 in | Wt 196.0 lb

## 2013-07-05 DIAGNOSIS — S99929A Unspecified injury of unspecified foot, initial encounter: Secondary | ICD-10-CM

## 2013-07-05 DIAGNOSIS — S99919A Unspecified injury of unspecified ankle, initial encounter: Secondary | ICD-10-CM

## 2013-07-05 DIAGNOSIS — S8990XA Unspecified injury of unspecified lower leg, initial encounter: Secondary | ICD-10-CM

## 2013-07-05 DIAGNOSIS — S99921A Unspecified injury of right foot, initial encounter: Secondary | ICD-10-CM

## 2013-07-05 DIAGNOSIS — M79609 Pain in unspecified limb: Secondary | ICD-10-CM

## 2013-07-05 DIAGNOSIS — M79671 Pain in right foot: Secondary | ICD-10-CM

## 2013-07-05 MED ORDER — TRAMADOL HCL 50 MG PO TABS: 50.0000 mg | ORAL_TABLET | Freq: Four times a day (QID) | ORAL | Status: DC | PRN

## 2013-07-05 NOTE — Patient Instructions (Signed)
You have a foot sprain between 4th and 5th digits. This will likely improve within the next 3-4 weeks. Buddy tape as shown for 4 more weeks regularly. Icing 15 minutes at a time 3-4 times a day as needed for pain, swelling. Elevate above the level of your heart as much as possible if needed for swelling. Tylenol 500mg  1-2 tabs three times a day as needed for pain. Aleve 2 tabs twice a day with food as needed for pain and inflammation. Take tramadol if the above couple medicines aren't helping enough - no driving on this. Follow up with me in 4 weeks only if not improving.

## 2013-07-10 ENCOUNTER — Encounter: Payer: Self-pay | Admitting: Family Medicine

## 2013-07-10 DIAGNOSIS — S99921A Unspecified injury of right foot, initial encounter: Secondary | ICD-10-CM | POA: Insufficient documentation

## 2013-07-10 NOTE — Progress Notes (Signed)
Patient ID: Christina [name], female   DOB: 09/15/1960, 53 y.o.   MRN: [id number]  PCP: Almedia BallsKELLY,SAM, MD  Subjective:   HPI: Patient is a 53 y.o. female here for right foot injury.  Patient reports about 2 weeks ago she tripped over some boxes. Caught the area between right 4th and 5th toe on corner of these. Immediate pain with bruising, swelling dorsal distal right foot laterally. Went to ED - radiographs negative for fracture. Given postop shoe and buddy taped initially. Not using either of these now. Pain has improved but still 3/10.  Past Medical History  Diagnosis Date  . Hypertension     No current outpatient prescriptions on file prior to visit.   No current facility-administered medications on file prior to visit.    Past Surgical History  Procedure Laterality Date  . Cholecystectomy    . Cesarean section      Allergies  Allergen Reactions  . Morphine And Related Itching    History   Social History  . Marital Status: Married    Spouse Name: N/A    Number of Children: N/A  . Years of Education: N/A   Occupational History  . Not on file.   Social History Main Topics  . Smoking status: Never Smoker   . Smokeless tobacco: Never Used  . Alcohol Use: No  . Drug Use: No  . Sexual Activity: Not on file   Other Topics Concern  . Not on file   Social History Narrative  . No narrative on file    Family History  Problem Relation Age of Onset  . CAD Father   . Hypertension Father   . Diabetes Mother   . Hypertension Mother   . Diabetes Sister   . Hypertension Sister   . Diabetes Brother   . Hypertension Brother   . Hyperlipidemia Neg Hx     BP 173/94  Pulse 70  Ht 5\' 5"  (1.651 m)  Wt 196 lb (88.905 kg)  BMI 32.62 kg/m2  Review of Systems: See HPI above.    Objective:  Physical Exam:  Gen: NAD  Right foot/ankle: Mild swelling/bruising between 4th, 5th digits.  No other deformity. FROM ankle.  Able to flex and extend all digits. TTP  greatest between 4th and 5th MT heads, less within these bones. Mild pain with metatarsal squeeze. Negative ant drawer and talar tilt.   Negative syndesmotic compression. Thompsons test negative. NV intact distally.  MSK u/s:  No evidence of cortical irregularity, neovascularity, edema overlying cortices of 4th, 5th metatarsals or phalanges.    Assessment & Plan:  1. Right foot injury - radiographs and ultrasound negative.  2/2 foot sprain.  Can take total of 6 weeks to improve.  Buddy taping regularly in this time.  Icing, tylenol, nsaids, tramadol if needed.  Postop shoe for comfort if needed.  F/u in 4 weeks or prn.

## 2013-07-10 NOTE — Assessment & Plan Note (Signed)
radiographs and ultrasound negative.  2/2 foot sprain.  Can take total of 6 weeks to improve.  Buddy taping regularly in this time.  Icing, tylenol, nsaids, tramadol if needed.  Postop shoe for comfort if needed.  F/u in 4 weeks or prn.

## 2013-11-09 ENCOUNTER — Emergency Department (HOSPITAL_COMMUNITY)
Admission: EM | Admit: 2013-11-09 | Discharge: 2013-11-09 | Discharge status: Against Medical Advice | Payer: BC Managed Care – PPO | Attending: Emergency Medicine | Admitting: Emergency Medicine

## 2013-11-09 ENCOUNTER — Encounter (HOSPITAL_COMMUNITY): Payer: Self-pay | Admitting: Emergency Medicine

## 2013-11-09 ENCOUNTER — Emergency Department (HOSPITAL_COMMUNITY): Payer: BC Managed Care – PPO

## 2013-11-09 DIAGNOSIS — I10 Essential (primary) hypertension: Secondary | ICD-10-CM | POA: Insufficient documentation

## 2013-11-09 DIAGNOSIS — R0602 Shortness of breath: Secondary | ICD-10-CM | POA: Insufficient documentation

## 2013-11-09 DIAGNOSIS — R079 Chest pain, unspecified: Secondary | ICD-10-CM

## 2013-11-09 DIAGNOSIS — Z9089 Acquired absence of other organs: Secondary | ICD-10-CM | POA: Insufficient documentation

## 2013-11-09 DIAGNOSIS — R072 Precordial pain: Secondary | ICD-10-CM | POA: Insufficient documentation

## 2013-11-09 LAB — COMPREHENSIVE METABOLIC PANEL
ALK PHOS: 130 U/L — AB (ref 39–117)
ALT: 24 U/L (ref 0–35)
AST: 27 U/L (ref 0–37)
Albumin: 4.4 g/dL (ref 3.5–5.2)
BILIRUBIN TOTAL: 0.6 mg/dL (ref 0.3–1.2)
BUN: 11 mg/dL (ref 6–23)
CHLORIDE: 102 meq/L (ref 96–112)
CO2: 24 mEq/L (ref 19–32)
Calcium: 9.7 mg/dL (ref 8.4–10.5)
Creatinine, Ser: 0.58 mg/dL (ref 0.50–1.10)
GFR calc non Af Amer: >90 mL/min (ref >90)
GLUCOSE: 94 mg/dL (ref 70–99)
POTASSIUM: 3.8 meq/L (ref 3.7–5.3)
SODIUM: 140 meq/L (ref 137–147)
TOTAL PROTEIN: 8.1 g/dL (ref 6.0–8.3)

## 2013-11-09 LAB — CBC
HCT: 39.7 % (ref 36.0–46.0)
Hemoglobin: 13.1 g/dL (ref 12.0–15.0)
MCH: 26.8 pg (ref 26.0–34.0)
MCHC: 33 g/dL (ref 30.0–36.0)
MCV: 81.4 fL (ref 78.0–100.0)
PLATELETS: 127 10*3/uL — AB (ref 150–400)
RBC: 4.88 MIL/uL (ref 3.87–5.11)
RDW: 13.2 % (ref 11.5–15.5)
WBC: 8.9 10*3/uL (ref 4.0–10.5)

## 2013-11-09 LAB — I-STAT TROPONIN, ED: Troponin i, poc: 0 ng/mL (ref 0.00–0.08)

## 2013-11-09 LAB — LIPASE, BLOOD: LIPASE: 42 U/L (ref 11–59)

## 2013-11-09 NOTE — ED Provider Notes (Signed)
CSN: [id number]     Arrival date & time 11/09/13  1824 History   First MD Initiated Contact with Patient 11/09/13 1847     Chief Complaint  Patient presents with  . Chest Pain     (Consider location/radiation/quality/duration/timing/severity/associated sxs/prior Treatment) Patient is a 53 y.o. female presenting with chest pain. The history is provided by the patient.  Chest Pain Pain location:  Substernal area Pain quality: tightness   Pain radiates to:  Does not radiate Pain radiates to the back: no   Pain severity:  Moderate Onset quality:  Sudden Duration:  30 minutes Timing:  Constant Progression:  Resolved Chronicity:  Recurrent (Happened one year ago - admitted, negative workup except for elevated LFTs, lipase - negative RUQ [place]) Context: at rest   Relieved by:  Nothing Worsened by:  Nothing tried Associated symptoms: shortness of breath   Associated symptoms: no abdominal pain, no cough, no fever, no nausea and not vomiting     Past Medical History  Diagnosis Date  . Hypertension    Past Surgical History  Procedure Laterality Date  . Cholecystectomy    . Cesarean section     Family History  Problem Relation Age of Onset  . CAD Father   . Hypertension Father   . Diabetes Mother   . Hypertension Mother   . Diabetes Sister   . Hypertension Sister   . Diabetes Brother   . Hypertension Brother   . Hyperlipidemia Neg Hx    History  Substance Use Topics  . Smoking status: Never Smoker   . Smokeless tobacco: Never Used  . Alcohol Use: No   OB History   Grav Para Term Preterm Abortions TAB SAB Ect Mult Living                 Review of Systems  Constitutional: Negative for fever.  Respiratory: Positive for shortness of breath. Negative for cough.   Cardiovascular: Positive for chest pain. Negative for leg swelling.  Gastrointestinal: Negative for nausea, vomiting and abdominal pain.  All other systems reviewed and are negative.     Allergies   Morphine and related  Home Medications   Prior to Admission medications   Medication Sig Start Date End Date Taking? Authorizing Provider  traMADol (ULTRAM) 50 MG tablet Take 1 tablet (50 mg total) by mouth every 6 (six) hours as needed. 07/05/13   Lenda Kelp, MD   BP 156/86  Pulse 84  Temp(Src) 97.9 F (36.6 C) (Oral)  Resp 16  SpO2 100% Physical Exam  Nursing note and vitals reviewed. Constitutional: She is oriented to person, place, and time. She appears well-developed and well-nourished. No distress.  HENT:  Head: Normocephalic and atraumatic.  Mouth/Throat: Oropharynx is clear and moist.  Eyes: EOM are normal. Pupils are equal, round, and reactive to light.  Neck: Normal range of motion. Neck supple.  Cardiovascular: Normal rate and regular rhythm.  Exam reveals no friction rub.   No murmur heard. Pulmonary/Chest: Effort normal and breath sounds normal. No respiratory distress. She has no wheezes. She has no rales.  Abdominal: Soft. She exhibits no distension. There is no tenderness. There is no rebound.  Musculoskeletal: Normal range of motion. She exhibits no edema.  Neurological: She is alert and oriented to person, place, and time.  Skin: No rash noted. She is not diaphoretic.    ED Course  Procedures (including critical care time) Labs Review Labs Reviewed  CBC  COMPREHENSIVE METABOLIC PANEL  LIPASE, BLOOD  Rosezena SensorI-STAT TROPOININ, ED    Imaging Review Dg Chest 2 View  11/09/2013   CLINICAL DATA:  53 year old female chest pain and pressure. Initial encounter.  EXAM: CHEST  2 VIEW  COMPARISON:  10/31/2012 and earlier.  FINDINGS: Stable and normal lung volumes. Normal cardiac size and mediastinal contours. Visualized tracheal air column is within normal limits. No pneumothorax, pulmonary edema, pleural effusion or confluent pulmonary opacity. No acute osseous abnormality identified. Stable right upper quadrant surgical clips.  IMPRESSION: Negative, no acute  cardiopulmonary abnormality.   Electronically Signed   By: Augusto GambleLee  Hall M.D.   On: 11/09/2013 19:02     EKG Interpretation   Date/Time:  Saturday Nov 09 2013 18:40:23 EDT Ventricular Rate:  78 PR Interval:  150 QRS Duration: 137 QT Interval:  403 QTC Calculation: 459 R Axis:   54 Text Interpretation:  Sinus rhythm Right bundle branch block Minimal ST  elevation, lateral leads similar to March 2014 Confirmed by Gwendolyn GrantWALDEN  MD,  Collins Kerby [phone] on 11/09/2013 6:49:01 PM      MDM   Final diagnoses:  Chest pain    21F with hx of HTN, cholecystectomy presents with chest pain. Began at rest. Lasted 30 minutes, resolved on its own. Squeezing type pain, nonradiating. No alleviating or exacerbating factors. CP here. Initial EKG with ventricular bigeminy, never had bigeminy before. Repeat EKG without bigeminy, stable from prior. Review of old records show she had elevated lipase at that time, was post cholecystectomy, LFTs and lipase trended down during her admission. AFVSS here. CP free. Will check labs.  Labs ok. Patient does not want to stay for serial troponin. She wants to go home since she is feeling better. She is of sound mind, able to make her own decisions. I explained to her the need for waiting on a serial troponin. She is aware of the risk and is ok with going home. AMA papers signed. Stable for discharge, instructed to f/u with Cardiology or her PCP for a stress test.  Dagmar HaitWilliam Kirstan Fentress, MD 11/09/13 2047

## 2013-11-09 NOTE — ED Notes (Signed)
Pt reports chest squeezing onset 20 min PTA. Denies associated symptoms. Multiform PVCs noted on monitor. Pt denies known Hx of this.

## 2013-11-09 NOTE — ED Notes (Signed)
Pt declined 2nd troponin, pt educated on necessity of the test, states she "feels well". Pt advised on why she will be signing AMA.

## 2013-11-09 NOTE — ED Notes (Signed)
During triage, pt stated the pain had subsided to a 2/10. No PVCs currently noted on monitor. Repeat EKG performed.

## 2013-11-09 NOTE — ED Notes (Signed)
MD at bedside. 

## 2013-11-09 NOTE — Discharge Instructions (Signed)
Chest Pain (Nonspecific) °It is often hard to give a specific diagnosis for the cause of chest pain. There is always a chance that your pain could be related to something serious, such as a heart attack or a blood clot in the lungs. You need to follow up with your caregiver for further evaluation. °CAUSES  °· Heartburn. °· Pneumonia or bronchitis. °· Anxiety or stress. °· Inflammation around your heart (pericarditis) or lung (pleuritis or pleurisy). °· A blood clot in the lung. °· A collapsed lung (pneumothorax). It can develop suddenly on its own (spontaneous pneumothorax) or from injury (trauma) to the chest. °· Shingles infection (herpes zoster virus). °The chest wall is composed of bones, muscles, and cartilage. Any of these can be the source of the pain. °· The bones can be bruised by injury. °· The muscles or cartilage can be strained by coughing or overwork. °· The cartilage can be affected by inflammation and become sore (costochondritis). °DIAGNOSIS  °Lab tests or other studies, such as X-rays, electrocardiography, stress testing, or cardiac imaging, may be needed to find the cause of your pain.  °TREATMENT  °· Treatment depends on what may be causing your chest pain. Treatment may include: °· Acid blockers for heartburn. °· Anti-inflammatory medicine. °· Pain medicine for inflammatory conditions. °· Antibiotics if an infection is present. °· You may be advised to change lifestyle habits. This includes stopping smoking and avoiding alcohol, caffeine, and chocolate. °· You may be advised to keep your head raised (elevated) when sleeping. This reduces the chance of acid going backward from your stomach into your esophagus. °· Most of the time, nonspecific chest pain will improve within 2 to 3 days with rest and mild pain medicine. °HOME CARE INSTRUCTIONS  °· If antibiotics were prescribed, take your antibiotics as directed. Finish them even if you start to feel better. °· For the next few days, avoid physical  activities that bring on chest pain. Continue physical activities as directed. °· Do not smoke. °· Avoid drinking alcohol. °· Only take over-the-counter or prescription medicine for pain, discomfort, or fever as directed by your caregiver. °· Follow your caregiver's suggestions for further testing if your chest pain does not go away. °· Keep any follow-up appointments you made. If you do not go to an appointment, you could develop lasting (chronic) problems with pain. If there is any problem keeping an appointment, you must call to reschedule. °SEEK MEDICAL CARE IF:  °· You think you are having problems from the medicine you are taking. Read your medicine instructions carefully. °· Your chest pain does not go away, even after treatment. °· You develop a rash with blisters on your chest. °SEEK IMMEDIATE MEDICAL CARE IF:  °· You have increased chest pain or pain that spreads to your arm, neck, jaw, back, or abdomen. °· You develop shortness of breath, an increasing cough, or you are coughing up blood. °· You have severe back or abdominal pain, feel nauseous, or vomit. °· You develop severe weakness, fainting, or chills. °· You have a fever. °THIS IS AN EMERGENCY. Do not wait to see if the pain will go away. Get medical help at once. Call your local emergency services (911 in [place] Do not drive yourself to the hospital. °MAKE SURE YOU:  °· Understand these instructions. °· Will watch your condition. °· Will get help right away if you are not doing well or get worse. °Document Released: 03/09/2005 Document Revised: 08/22/2011 Document Reviewed: 01/03/2008 °ExitCare® Patient Information ©2014 [employer]   [employer]. ° °

## 2013-12-22 ENCOUNTER — Emergency Department (HOSPITAL_BASED_OUTPATIENT_CLINIC_OR_DEPARTMENT_OTHER)
Admission: EM | Admit: 2013-12-22 | Discharge: 2013-12-23 | Disposition: A | Discharge status: Home / Self Care | Payer: BC Managed Care – PPO | Attending: Emergency Medicine | Admitting: Emergency Medicine

## 2013-12-22 ENCOUNTER — Encounter (HOSPITAL_BASED_OUTPATIENT_CLINIC_OR_DEPARTMENT_OTHER): Payer: Self-pay | Admitting: Emergency Medicine

## 2013-12-22 ENCOUNTER — Emergency Department (HOSPITAL_BASED_OUTPATIENT_CLINIC_OR_DEPARTMENT_OTHER): Payer: BC Managed Care – PPO

## 2013-12-22 DIAGNOSIS — Y9368 Activity, volleyball (beach) (court): Secondary | ICD-10-CM | POA: Insufficient documentation

## 2013-12-22 DIAGNOSIS — I10 Essential (primary) hypertension: Secondary | ICD-10-CM | POA: Insufficient documentation

## 2013-12-22 DIAGNOSIS — X500XXA Overexertion from strenuous movement or load, initial encounter: Secondary | ICD-10-CM | POA: Insufficient documentation

## 2013-12-22 DIAGNOSIS — S93409A Sprain of unspecified ligament of unspecified ankle, initial encounter: Secondary | ICD-10-CM | POA: Insufficient documentation

## 2013-12-22 DIAGNOSIS — S93402A Sprain of unspecified ligament of left ankle, initial encounter: Secondary | ICD-10-CM

## 2013-12-22 DIAGNOSIS — Y92838 Other recreation area as the place of occurrence of the external cause: Secondary | ICD-10-CM

## 2013-12-22 DIAGNOSIS — Z791 Long term (current) use of non-steroidal anti-inflammatories (NSAID): Secondary | ICD-10-CM | POA: Insufficient documentation

## 2013-12-22 DIAGNOSIS — Y9239 Other specified sports and athletic area as the place of occurrence of the external cause: Secondary | ICD-10-CM | POA: Insufficient documentation

## 2013-12-22 MED ORDER — IBUPROFEN 800 MG PO TABS
800.0000 mg | ORAL_TABLET | Freq: Once | ORAL | Status: AC
  Administered 2013-12-23: 800 mg via ORAL
  Filled 2013-12-22: qty 1

## 2013-12-22 NOTE — ED Notes (Signed)
Pt reports yesterday was playing volleyball when L ankle twisted inward.  Having pain and swelling at ankle area.

## 2013-12-22 NOTE — ED Provider Notes (Signed)
CSN: [id number]     Arrival date & time 12/22/13  2233 History  This chart was scribed for Christina Gaskins, MD by Modena Jansky, ED Scribe. This patient was seen in room MH12/MH12 and the patient's care was started at 11:09 PM.  Chief Complaint  Patient presents with  . Ankle Pain   Patient is a 53 y.o. female presenting with ankle pain. The history is provided by the patient. No language interpreter was used.  Ankle Pain Location:  Ankle Time since incident:  1 day Injury: yes   Mechanism of injury: fall   Fall:    Point of impact:  Feet   Entrapped after fall: no   Ankle location:  L ankle Relieved by:  Nothing Worsened by:  Activity Ineffective treatments:  None tried  HPI Comments: Christina [name] is a 53 y.o. female who presents to the Emergency Department complaining of left constant moderate ankle pain that started yesterday. Pt states she twisted her ankle while playing volleyball in [place]. She reports that she did not fall. She states that the pain is exacerbated by ambulation. She states that she has associated swelling in left ankle. She reports no other modifying factors. She denies any other injury or LOC.   Past Medical History  Diagnosis Date  . Hypertension    Past Surgical History  Procedure Laterality Date  . Cholecystectomy    . Cesarean section     Family History  Problem Relation Age of Onset  . CAD Father   . Hypertension Father   . Diabetes Mother   . Hypertension Mother   . Diabetes Sister   . Hypertension Sister   . Diabetes Brother   . Hypertension Brother   . Hyperlipidemia Neg Hx    History  Substance Use Topics  . Smoking status: Never Smoker   . Smokeless tobacco: Never Used  . Alcohol Use: No   OB History   Grav Para Term Preterm Abortions TAB SAB Ect Mult Living                 Review of Systems  Musculoskeletal: Positive for joint swelling and myalgias.  Neurological: Negative for syncope.    Allergies  Morphine and  related  Home Medications   Prior to Admission medications   Medication Sig Start Date End Date Taking? Authorizing Provider  ibuprofen (ADVIL,MOTRIN) 200 MG tablet Take 400 mg by mouth every 6 (six) hours as needed for moderate pain.    Historical Provider, MD  VITAMIN D, CHOLECALCIFEROL, PO Take 1 capsule by mouth daily.    Historical Provider, MD  VITAMIN E PO Take 1 capsule by mouth daily.    Historical Provider, MD   BP 181/70  Pulse 83  Temp(Src) 99.5 F (37.5 C) (Oral)  Resp 20  Ht 5\' 5"  (1.651 m)  SpO2 100% Physical Exam CONSTITUTIONAL: Well developed/well nourished HEAD: Normocephalic/atraumatic ENMT: Mucous membranes moist NECK: supple no meningeal signs LUNGS:  no apparent distress NEURO: Pt is awake/alert, moves all extremitiesx4 EXTREMITIES: pulses normal, full ROM, tenderness and swelling to left lateral medial malleolus, no foot tenderness noted, no knee tenderness, left Achille's intact    SKIN: warm, color normal PSYCH: no abnormalities of mood noted   ED Course  Procedures DIAGNOSTIC STUDIES: Oxygen Saturation is 100% on RA, normal by my interpretation.    COORDINATION OF CARE: 11:13 PM- Pt advised of plan for treatment and pt agrees.  Imaging Review Dg Ankle Complete Left  12/22/2013   CLINICAL  DATA:  Pain post trauma  EXAM: LEFT ANKLE COMPLETE - 3+ VIEW  COMPARISON:  None.  FINDINGS: Frontal, oblique, and lateral views were obtained. There is mild generalized soft tissue swelling. No fracture or effusion. Ankle mortise appears intact. There is osteoarthritic change medially. There is a small inferior calcaneal spur.  IMPRESSION: Soft tissue swelling. No fracture. Mortise intact. Osteoarthritic change medially. Inferior calcaneal spur present.   Electronically Signed   By: Bretta BangWilliam  Woodruff M.D.   On: 12/22/2013 23:12   MDM   Final diagnoses:  Left ankle sprain, initial encounter    Nursing notes including past medical history and social history  reviewed and considered in documentation xrays reviewed and considered   I personally performed the services described in this documentation, which was scribed in my presence. The recorded information has been reviewed and is accurate.       Christina Gaskinsonald W Devanshi Califf, MD 12/22/13 [phone]

## 2013-12-22 NOTE — Discharge Instructions (Signed)

## 2013-12-26 ENCOUNTER — Ambulatory Visit (INDEPENDENT_AMBULATORY_CARE_PROVIDER_SITE_OTHER): Payer: BC Managed Care – PPO | Admitting: Family Medicine

## 2013-12-26 ENCOUNTER — Encounter: Payer: Self-pay | Admitting: Family Medicine

## 2013-12-26 VITALS — BP 143/87 | HR 73 | Ht 65.0 in | Wt 218.0 lb

## 2013-12-26 DIAGNOSIS — S93409A Sprain of unspecified ligament of unspecified ankle, initial encounter: Secondary | ICD-10-CM

## 2013-12-26 DIAGNOSIS — S93402A Sprain of unspecified ligament of left ankle, initial encounter: Secondary | ICD-10-CM

## 2013-12-26 NOTE — Patient Instructions (Signed)
You have an ankle sprain. Ice the area for 15 minutes at a time, 3-4 times a day for pain and swelling next 4 weeks. Aleve 2 tabs twice a day with food OR ibuprofen 3 tabs three times a day with food for pain and inflammation. Elevate above the level of your heart when possible Ok to walk on this. I would wear the ankle brace for at least 2 more weeks then only if you need it. Come out of the boot/brace twice a day to do Up/down and alphabet exercises 2-3 sets of each. In about 1-2 weeks start theraband strengthening exercises once a day 3 sets of 10 for 4 weeks. Consider physical therapy for strengthening and balance exercises if you're not improving. Follow up with me in 4 weeks or as needed.  These are the four different classes of medicines you can use for arthritis pain: Take tylenol 500mg  1-2 tabs three times a day for pain. Aleve 1-2 tabs twice a day with food Glucosamine sulfate 750mg  twice a day is a supplement that may help. Capsaicin topically up to four times a day may also help with pain.

## 2013-12-30 ENCOUNTER — Encounter: Payer: Self-pay | Admitting: Family Medicine

## 2013-12-30 DIAGNOSIS — S93402A Sprain of unspecified ligament of left ankle, initial encounter: Secondary | ICD-10-CM | POA: Insufficient documentation

## 2013-12-30 NOTE — Progress Notes (Signed)
Patient ID: Urban Gibsonieves Korn, female   DOB: 12/02/1960, 53 y.o.   MRN: [id number]  PCP: Almedia BallsKELLY,SAM, MD  Subjective:   HPI: Patient is a 53 y.o. female here for left ankle injury.  Patient [place] she accidentally inverted left foot/ankle. Improved since then but still with pain anterior ankle joint. Able to bear weight after this and currently. Given an aircast but not wearing currently. Has been icing. Radiographs negative in ED. + swelling. Also had a general question about arthritis instructions for her fingers - provided these.  Past Medical History  Diagnosis Date  . Hypertension     Current Outpatient Prescriptions on File Prior to Visit  Medication Sig Dispense Refill  . ibuprofen (ADVIL,MOTRIN) 200 MG tablet Take 400 mg by mouth every 6 (six) hours as needed for moderate pain.      Marland Kitchen. VITAMIN D, CHOLECALCIFEROL, PO Take 1 capsule by mouth daily.      Marland Kitchen. VITAMIN E PO Take 1 capsule by mouth daily.       No current facility-administered medications on file prior to visit.    Past Surgical History  Procedure Laterality Date  . Cholecystectomy    . Cesarean section      Allergies  Allergen Reactions  . Morphine And Related Itching    History   Social History  . Marital Status: Married    Spouse Name: N/A    Number of Children: N/A  . Years of Education: N/A   Occupational History  . Not on file.   Social History Main Topics  . Smoking status: Never Smoker   . Smokeless tobacco: Never Used  . Alcohol Use: No  . Drug Use: No  . Sexual Activity: Not on file   Other Topics Concern  . Not on file   Social History Narrative  . No narrative on file    Family History  Problem Relation Age of Onset  . CAD Father   . Hypertension Father   . Diabetes Mother   . Hypertension Mother   . Diabetes Sister   . Hypertension Sister   . Diabetes Brother   . Hypertension Brother   . Hyperlipidemia Neg Hx     BP 143/87  Pulse  73  Ht 5\' 5"  (1.651 m)  Wt 218 lb (98.884 kg)  BMI 36.28 kg/m2  Review of Systems: See HPI above.    Objective:  Physical Exam:  Gen: NAD  Left ankle/foot: Mild swelling across ankle.  No other gross deformity, ecchymoses FROM TTP anterior ankle joint, medial to ATFL.  No bony tenderness. 1+ talar tilt.  Negative ant drawer.  Negative syndesmotic compression. Thompsons test negative. NV intact distally.    Assessment & Plan:  1. Left ankle sprain - reassured.  Radiographs negative - brief bedside ultrasound also negative for occult fracture.  Icing, nsaids as needed.  Weight bearing as tolerated.  2 more weeks of aircast.  Start HEP which was reviewed today.  F/u in 4 weeks or prn - consider PT, MRI if not improving.

## 2013-12-30 NOTE — Assessment & Plan Note (Signed)
reassured.  Radiographs negative - brief bedside ultrasound also negative for occult fracture.  Icing, nsaids as needed.  Weight bearing as tolerated.  2 more weeks of aircast.  Start HEP which was reviewed today.  F/u in 4 weeks or prn - consider PT, MRI if not improving.

## 2014-04-08 ENCOUNTER — Emergency Department (HOSPITAL_COMMUNITY)
Admission: EM | Admit: 2014-04-08 | Discharge: 2014-04-09 | Disposition: A | Discharge status: Home / Self Care | Payer: BC Managed Care – PPO | Attending: Emergency Medicine | Admitting: Emergency Medicine

## 2014-04-08 ENCOUNTER — Encounter (HOSPITAL_COMMUNITY): Payer: Self-pay | Admitting: Emergency Medicine

## 2014-04-08 DIAGNOSIS — I10 Essential (primary) hypertension: Secondary | ICD-10-CM | POA: Diagnosis not present

## 2014-04-08 DIAGNOSIS — Y9389 Activity, other specified: Secondary | ICD-10-CM | POA: Diagnosis not present

## 2014-04-08 DIAGNOSIS — S299XXA Unspecified injury of thorax, initial encounter: Secondary | ICD-10-CM | POA: Diagnosis present

## 2014-04-08 DIAGNOSIS — S20212A Contusion of left front wall of thorax, initial encounter: Secondary | ICD-10-CM | POA: Diagnosis not present

## 2014-04-08 DIAGNOSIS — Y9289 Other specified places as the place of occurrence of the external cause: Secondary | ICD-10-CM | POA: Diagnosis not present

## 2014-04-08 DIAGNOSIS — W500XXA Accidental hit or strike by another person, initial encounter: Secondary | ICD-10-CM | POA: Diagnosis not present

## 2014-04-08 DIAGNOSIS — R0789 Other chest pain: Secondary | ICD-10-CM
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
## 2014-04-09 ENCOUNTER — Emergency Department (HOSPITAL_COMMUNITY): Payer: BC Managed Care – PPO

## 2014-04-09 NOTE — Discharge Instructions (Signed)
Chest Contusion A chest contusion is a deep bruise on your chest area. Contusions are the result of an injury that caused bleeding under the skin. A chest contusion may involve bruising of the skin, muscles, or ribs. The contusion may turn blue, purple, or yellow. Minor injuries will give you a painless contusion, but more severe contusions may stay painful and swollen for a few weeks. CAUSES  A contusion is usually caused by a blow, trauma, or direct force to an area of the body. SYMPTOMS   Swelling and redness of the injured area.  Discoloration of the injured area.  Tenderness and soreness of the injured area.  Pain. DIAGNOSIS  The diagnosis can be made by taking a history and performing a physical exam. An X-ray, CT scan, or MRI may be needed to determine if there were any associated injuries, such as broken bones (fractures) or internal injuries. TREATMENT  Often, the best treatment for a chest contusion is resting, icing, and applying cold compresses to the injured area. Deep breathing exercises may be recommended to reduce the risk of pneumonia. Over-the-counter medicines may also be recommended for pain control. HOME CARE INSTRUCTIONS   Put ice on the injured area.  Put ice in a plastic bag.  Place a towel between your skin and the bag.  Leave the ice on for 15-20 minutes, 03-04 times a day.  Only take over-the-counter or prescription medicines as directed by your caregiver. Your caregiver may recommend avoiding anti-inflammatory medicines (aspirin, ibuprofen, and naproxen) for 48 hours because these medicines may increase bruising.  Rest the injured area.  Perform deep-breathing exercises as directed by your caregiver.  Stop smoking if you smoke.  Do not lift objects over 5 pounds (2.3 kg) for 3 days or longer if recommended by your caregiver. SEEK IMMEDIATE MEDICAL CARE IF:   You have increased bruising or swelling.  You have pain that is getting worse.  You have  difficulty breathing.  You have dizziness, weakness, or fainting.  You have blood in your urine or stool.  You cough up or vomit blood.  Your swelling or pain is not relieved with medicines. MAKE SURE YOU:   Understand these instructions.  Will watch your condition.  Will get help right away if you are not doing well or get worse. Document Released: 02/22/2001 Document Revised: 02/22/2012 Document Reviewed: 11/21/2011 [facility] Patient Information 2015 [place], [place]. This information is not intended to replace advice given to you by your health care provider. Make sure you discuss any questions you have with your health care provider.  Tylenol or ibuprofen for pain.  Follow-up with your primary care provider if no improvement in symptoms.

## 2014-04-11 NOTE — ED Provider Notes (Signed)
Medical screening examination/treatment/procedure(s) were performed by non-physician practitioner and as supervising physician I was immediately available for consultation/collaboration.   EKG Interpretation None       Hurman HornJohn M Shemuel Harkleroad, MD 04/11/14 [phone]

## 2014-07-30 DIAGNOSIS — L209 Atopic dermatitis, unspecified: Secondary | ICD-10-CM | POA: Insufficient documentation

## 2015-01-06 ENCOUNTER — Ambulatory Visit: Payer: Self-pay | Admitting: Family Medicine

## 2015-01-07 ENCOUNTER — Encounter: Payer: Self-pay | Admitting: Family Medicine

## 2015-01-07 ENCOUNTER — Ambulatory Visit (INDEPENDENT_AMBULATORY_CARE_PROVIDER_SITE_OTHER): Payer: BLUE CROSS/BLUE SHIELD | Admitting: Family Medicine

## 2015-01-07 VITALS — BP 144/86 | HR 74 | Ht 65.0 in | Wt 204.0 lb

## 2015-01-07 DIAGNOSIS — M722 Plantar fascial fibromatosis: Secondary | ICD-10-CM

## 2015-01-07 NOTE — Patient Instructions (Signed)
You have plantar fasciitis Take tylenol or aleve as needed for pain  Plantar fascia stretch for 20-30 seconds (do 3 of these) in morning Lowering/raise on a step exercises 3 x 10 once or twice a day - this is very important for long term recovery. Can add heel walks, toe walks forward and backward as well Ice heel for 15 minutes as needed. Avoid flat shoes/barefoot walking as much as possible. Arch straps have been shown to help with pain. Try the green insoles or Dr. Jari Sportsman active series when up and walking around. Steroid injection is a consideration for short term pain relief if you are struggling. Physical therapy is also an option. Follow up with me in about 6 weeks for reevaluation.

## 2015-01-08 DIAGNOSIS — M722 Plantar fascial fibromatosis: Secondary | ICD-10-CM | POA: Insufficient documentation

## 2015-01-08 NOTE — Progress Notes (Signed)
PCP: Almedia Balls, MD  Subjective:   HPI: Patient is a 54 y.o. female here for left foot pain.  Patient reports she's had plantar left heel pain about 2 weeks. No known injury or trauma. Works 40 hours a week with a lot of standing, walking on hard floors. Taking meloxicam as needed.  Past Medical History  Diagnosis Date  . Hypertension     Current Outpatient Prescriptions on File Prior to Visit  Medication Sig Dispense Refill  . ibuprofen (ADVIL,MOTRIN) 200 MG tablet Take 400 mg by mouth every 6 (six) hours as needed for moderate pain.     No current facility-administered medications on file prior to visit.    Past Surgical History  Procedure Laterality Date  . Cholecystectomy    . Cesarean section      Allergies  Allergen Reactions  . Morphine And Related Itching  . Buprenorphine Hcl Itching    History   Social History  . Marital Status: Married    Spouse Name: N/A  . Number of Children: N/A  . Years of Education: N/A   Occupational History  . Not on file.   Social History Main Topics  . Smoking status: Never Smoker   . Smokeless tobacco: Never Used  . Alcohol Use: No  . Drug Use: No  . Sexual Activity: Not on file   Other Topics Concern  . Not on file   Social History Narrative    Family History  Problem Relation Age of Onset  . CAD Father   . Hypertension Father   . Diabetes Mother   . Hypertension Mother   . Diabetes Sister   . Hypertension Sister   . Diabetes Brother   . Hypertension Brother   . Hyperlipidemia Neg Hx     BP 144/86 mmHg  Pulse 74  Ht  (1.651 m)  Wt 204 lb (92.534 kg)  BMI 33.95 kg/m2  Review of Systems: See HPI above.    Objective:  Physical Exam:  Gen: NAD  Left foot/ankle: No gross deformity, swelling, ecchymoses FROM TTP plantar fascia at medial insertion on calcaneus. Negative ant drawer and talar tilt.   Negative syndesmotic compression. Negative calcaneal squeeze. Thompsons test negative. NV  intact distally.    Assessment & Plan:  1. Left plantar fasciitis - shown home exercises to do daily.  Arch binders, discussed better arch supports for her mild overpronation.  Icing, tylenol/nsaids as needed.  F/u in 6 weeks.  Consider injection, PT, custom orthotics if not improving.

## 2015-01-08 NOTE — Assessment & Plan Note (Signed)
shown home exercises to do daily.  Arch binders, discussed better arch supports for her mild overpronation.  Icing, tylenol/nsaids as needed.  F/u in 6 weeks.  Consider injection, PT, custom orthotics if not improving.

## 2015-03-24 ENCOUNTER — Telehealth: Payer: Self-pay | Admitting: Family Medicine

## 2015-03-25 ENCOUNTER — Ambulatory Visit (INDEPENDENT_AMBULATORY_CARE_PROVIDER_SITE_OTHER): Payer: BLUE CROSS/BLUE SHIELD | Admitting: Family Medicine

## 2015-03-25 ENCOUNTER — Encounter: Payer: Self-pay | Admitting: Family Medicine

## 2015-03-25 VITALS — BP 177/98 | HR 76 | Ht 65.0 in | Wt 208.0 lb

## 2015-03-25 DIAGNOSIS — M722 Plantar fascial fibromatosis: Secondary | ICD-10-CM

## 2015-03-25 NOTE — Patient Instructions (Signed)
You have plantar fasciitis Take tylenol or aleve as needed for pain  Start physical therapy for next 6 weeks. Make an appointment for custom orthotics at your convenience - plan for it to take 45 minutes to make these. Plantar fascia stretch for 20-30 seconds (do 3 of these) in morning Lowering/raise on a step exercises 3 x 10 once or twice a day - this is very important for long term recovery. Can add heel walks, toe walks forward and backward as well Ice heel for 15 minutes as needed. Avoid flat shoes/barefoot walking as much as possible. Arch straps have been shown to help with pain. Steroid injection is a consideration for short term pain relief if you are struggling. Follow up with me in about 6 weeks for reevaluation unless you come sooner for me to make you orthotics.

## 2015-03-26 NOTE — Assessment & Plan Note (Signed)
Start physical therapy in addition to home exercises.  Tylenol/nsaids if needed.  Continue arch binder and arch supports.  Considering appointment here for custom orthotics as well.  Icing as needed.  F/u in 6 weeks.  Consider injection as well.

## 2015-03-26 NOTE — Progress Notes (Signed)
PCP: Almedia BallsKELLY,SAM, MD  Subjective:   HPI: Patient is a 54 y.o. female here for left foot pain.  7/27: Patient reports she's had plantar left heel pain about 2 weeks. No known injury or trauma. Works 40 hours a week with a lot of standing, walking on hard floors. Taking meloxicam as needed.  10/12: Patient reports she has 5/10 plantar left foot, same or mildly worse than last visit. Not doing exercises regularly. Pain from heel into ball of foot. Constant pulling pain, some tingling/crawling sensation lateral lower leg as well that is off and on. Using binders and arch supports. No skin changes, fever, other complaints.  Past Medical History  Diagnosis Date  . Hypertension     Current Outpatient Prescriptions on File Prior to Visit  Medication Sig Dispense Refill  . ibuprofen (ADVIL,MOTRIN) 200 MG tablet Take 400 mg by mouth every 6 (six) hours as needed for moderate pain.     No current facility-administered medications on file prior to visit.    Past Surgical History  Procedure Laterality Date  . Cholecystectomy    . Cesarean section      Allergies  Allergen Reactions  . Morphine And Related Itching  . Buprenorphine Hcl Itching    Social History   Social History  . Marital Status: Married    Spouse Name: N/A  . Number of Children: N/A  . Years of Education: N/A   Occupational History  . Not on file.   Social History Main Topics  . Smoking status: Never Smoker   . Smokeless tobacco: Never Used  . Alcohol Use: No  . Drug Use: No  . Sexual Activity: Not on file   Other Topics Concern  . Not on file   Social History Narrative    Family History  Problem Relation Age of Onset  . CAD Father   . Hypertension Father   . Diabetes Mother   . Hypertension Mother   . Diabetes Sister   . Hypertension Sister   . Diabetes Brother   . Hypertension Brother   . Hyperlipidemia Neg Hx     BP 177/98 mmHg  Pulse 76  Ht 5\' 5"  (1.651 m)  Wt 208 lb (94.348 kg)   BMI 34.61 kg/m2  Review of Systems: See HPI above.    Objective:  Physical Exam:  Gen: NAD  Left foot/ankle: No gross deformity, swelling, ecchymoses FROM TTP plantar fascia at medial insertion on calcaneus. Negative ant drawer and talar tilt.   Negative syndesmotic compression. Negative calcaneal squeeze. Thompsons test negative. No tenderness lower leg.  Negative tinels at fibular head. NV intact distally.  Right foot/ankle: FROM without pain, weakness.    Assessment & Plan:  1. Left plantar fasciitis - Start physical therapy in addition to home exercises.  Tylenol/nsaids if needed.  Continue arch binder and arch supports.  Considering appointment here for custom orthotics as well.  Icing as needed.  F/u in 6 weeks.  Consider injection as well.

## 2015-05-04 ENCOUNTER — Ambulatory Visit: Payer: BLUE CROSS/BLUE SHIELD | Admitting: Family Medicine

## 2015-05-06 ENCOUNTER — Emergency Department (HOSPITAL_BASED_OUTPATIENT_CLINIC_OR_DEPARTMENT_OTHER)
Admission: EM | Admit: 2015-05-06 | Discharge: 2015-05-06 | Disposition: A | Discharge status: Home / Self Care | Payer: BLUE CROSS/BLUE SHIELD | Attending: Emergency Medicine | Admitting: Emergency Medicine

## 2015-05-06 ENCOUNTER — Emergency Department (HOSPITAL_BASED_OUTPATIENT_CLINIC_OR_DEPARTMENT_OTHER): Payer: BLUE CROSS/BLUE SHIELD

## 2015-05-06 ENCOUNTER — Encounter (HOSPITAL_BASED_OUTPATIENT_CLINIC_OR_DEPARTMENT_OTHER): Payer: Self-pay

## 2015-05-06 DIAGNOSIS — I10 Essential (primary) hypertension: Secondary | ICD-10-CM | POA: Insufficient documentation

## 2015-05-06 DIAGNOSIS — Z9889 Other specified postprocedural states: Secondary | ICD-10-CM | POA: Insufficient documentation

## 2015-05-06 DIAGNOSIS — K219 Gastro-esophageal reflux disease without esophagitis: Secondary | ICD-10-CM

## 2015-05-06 DIAGNOSIS — R1013 Epigastric pain: Secondary | ICD-10-CM

## 2015-05-06 DIAGNOSIS — Z9049 Acquired absence of other specified parts of digestive tract: Secondary | ICD-10-CM | POA: Diagnosis not present

## 2015-05-06 LAB — COMPREHENSIVE METABOLIC PANEL
ALK PHOS: 103 U/L (ref 38–126)
ALT: 28 U/L (ref 14–54)
ANION GAP: 8 (ref 5–15)
AST: 25 U/L (ref 15–41)
Albumin: 4 g/dL (ref 3.5–5.0)
BILIRUBIN TOTAL: 0.6 mg/dL (ref 0.3–1.2)
BUN: 17 mg/dL (ref 6–20)
CALCIUM: 9 mg/dL (ref 8.9–10.3)
CO2: 26 mmol/L (ref 22–32)
CREATININE: 0.64 mg/dL (ref 0.44–1.00)
Chloride: 105 mmol/L (ref 101–111)
GFR calc non Af Amer: >60 mL/min (ref >60)
Glucose, Bld: 102 mg/dL — ABNORMAL HIGH (ref 65–99)
Potassium: 4 mmol/L (ref 3.5–5.1)
Sodium: 139 mmol/L (ref 135–145)
TOTAL PROTEIN: 7.3 g/dL (ref 6.5–8.1)

## 2015-05-06 LAB — CBC WITH DIFFERENTIAL/PLATELET
Basophils Absolute: 0 10*3/uL (ref 0.0–0.1)
Basophils Relative: 0 %
EOS ABS: 0.1 10*3/uL (ref 0.0–0.7)
Eosinophils Relative: 1 %
HEMATOCRIT: 38.2 % (ref 36.0–46.0)
Hemoglobin: 12 g/dL (ref 12.0–15.0)
Lymphocytes Relative: 54 %
Lymphs Abs: 5.1 10*3/uL — ABNORMAL HIGH (ref 0.7–4.0)
MCH: 26.1 pg (ref 26.0–34.0)
MCHC: 31.4 g/dL (ref 30.0–36.0)
MCV: 83.2 fL (ref 78.0–100.0)
MONOS PCT: 6 %
Monocytes Absolute: 0.6 10*3/uL (ref 0.1–1.0)
Neutro Abs: 3.6 10*3/uL (ref 1.7–7.7)
Neutrophils Relative %: 39 %
Platelets: 271 10*3/uL (ref 150–400)
RBC: 4.59 MIL/uL (ref 3.87–5.11)
RDW: 13.7 % (ref 11.5–15.5)
WBC: 9.3 10*3/uL (ref 4.0–10.5)

## 2015-05-06 LAB — LIPASE, BLOOD: Lipase: 42 U/L (ref 11–51)

## 2015-05-06 MED ORDER — OMEPRAZOLE 20 MG PO CPDR: 40.0000 mg | DELAYED_RELEASE_CAPSULE | Freq: Two times a day (BID) | ORAL | Status: DC

## 2015-05-06 MED ORDER — IOHEXOL 300 MG/ML  SOLN
100.0000 mL | Freq: Once | INTRAMUSCULAR | Status: AC | PRN
  Administered 2015-05-06: 50 mL via INTRAVENOUS

## 2015-05-06 MED ORDER — PANTOPRAZOLE SODIUM 40 MG IV SOLR
40.0000 mg | Freq: Once | INTRAVENOUS | Status: AC
  Administered 2015-05-06: 40 mg via INTRAVENOUS
  Filled 2015-05-06: qty 40

## 2015-05-06 MED ORDER — FAMOTIDINE 20 MG PO TABS: 40.0000 mg | ORAL_TABLET | Freq: Two times a day (BID) | ORAL | Status: DC

## 2015-05-06 MED ORDER — FAMOTIDINE 20 MG PO TABS: 40.0000 mg | ORAL_TABLET | Freq: Once | ORAL | Status: DC

## 2015-05-06 MED ORDER — FAMOTIDINE IN NACL 20-0.9 MG/50ML-% IV SOLN
20.0000 mg | Freq: Once | INTRAVENOUS | Status: AC
  Administered 2015-05-06: 20 mg via INTRAVENOUS
  Filled 2015-05-06: qty 50

## 2015-05-06 MED ORDER — PANTOPRAZOLE SODIUM 40 MG PO TBEC: 40.0000 mg | DELAYED_RELEASE_TABLET | Freq: Once | ORAL | Status: DC

## 2015-05-06 MED ORDER — IOHEXOL 300 MG/ML  SOLN
25.0000 mL | Freq: Once | INTRAMUSCULAR | Status: AC | PRN
  Administered 2015-05-06: 25 mL via ORAL

## 2015-05-06 MED ORDER — SODIUM CHLORIDE 0.9 % IV BOLUS (SEPSIS)
1000.0000 mL | Freq: Once | INTRAVENOUS | Status: AC
  Administered 2015-05-06: 1000 mL via INTRAVENOUS

## 2015-05-06 MED ORDER — IOHEXOL 300 MG/ML  SOLN
50.0000 mL | Freq: Once | INTRAMUSCULAR | Status: AC | PRN
  Administered 2015-05-06: 50 mL via INTRAVENOUS

## 2015-05-06 NOTE — Discharge Instructions (Signed)
You have been seen today for epigastric pain. Your imaging and lab tests showed no abnormalities. Follow up with PCP next week for chronic management of this issue. Return to ED should symptoms worsen. It should be noted that it takes at least 2 weeks for the effects of the omeprazole to reach their full benefit. You may also increase the dose from 40 mg once a day to 40 mg twice a day. You should take this omeprazole 30 minutes before breakfast and the second dose 30 minutes before your evening meal.

## 2015-05-06 NOTE — ED Provider Notes (Signed)
CSN: [id number]     Arrival date & time 05/06/15  1812 History   First MD Initiated Contact with Patient 05/06/15 1819     Chief Complaint  Patient presents with  . Abdominal Pain     (Consider location/radiation/quality/duration/timing/severity/associated sxs/prior Treatment) HPI   Christina [name] is a 54 y.o. female, with a history of cholecystectomy and pancreatitis, presenting to the ED with epigastric abdominal pain that began a week ago. Pain sometimes gets better when she eats. Pain does not change with position. Pt states she went to see her PCP on Nov 14, was prescribed omeprazole 40 mg daily, which pt has been taking everyday since then. Pt states she "feels bloated." Pt is still passing gas and her last BM was earlier today and it was normal. Pt rates the pain at 6/10, "feels like bloating," and sometimes moves into the back of her throat. Pt has tried some pepto bismol with minimal relief.     Past Medical History  Diagnosis Date  . Hypertension    Past Surgical History  Procedure Laterality Date  . Cholecystectomy    . Cesarean section     Family History  Problem Relation Age of Onset  . CAD Father   . Hypertension Father   . Diabetes Mother   . Hypertension Mother   . Diabetes Sister   . Hypertension Sister   . Diabetes Brother   . Hypertension Brother   . Hyperlipidemia Neg Hx    Social History  Substance Use Topics  . Smoking status: Never Smoker   . Smokeless tobacco: Never Used  . Alcohol Use: No   OB History    No data available     Review of Systems  Constitutional: Negative for fever, chills, diaphoresis and unexpected weight change.  Respiratory: Negative for cough, chest tightness and shortness of breath.   Cardiovascular: Negative for chest pain, palpitations and leg swelling.  Gastrointestinal: Positive for abdominal pain. Negative for nausea, vomiting, diarrhea and constipation.  Genitourinary: Negative for dysuria and flank pain.   Musculoskeletal: Negative for back pain.  Skin: Negative for color change and pallor.  Neurological: Negative for dizziness, syncope, weakness and light-headedness.  All other systems reviewed and are negative.     Allergies  Morphine and related and Buprenorphine hcl  Home Medications   Prior to Admission medications   Medication Sig Start Date End Date Taking? Authorizing Provider  famotidine (PEPCID) 20 MG tablet Take 2 tablets (40 mg total) by mouth 2 (two) times daily. 05/06/15   Shawn C Joy, PA-C  ibuprofen (ADVIL,MOTRIN) 200 MG tablet Take 400 mg by mouth every 6 (six) hours as needed for moderate pain.    Historical Provider, MD  omeprazole (PRILOSEC) 20 MG capsule Take 2 capsules (40 mg total) by mouth 2 (two) times daily before a meal. 05/06/15   Shawn C Joy, PA-C   BP 171/91 mmHg  Pulse 76  Temp(Src) 98.4 F (36.9 C) (Oral)  Resp 18  Ht  (1.651 m)  Wt 95.709 kg  BMI 35.11 kg/m2  SpO2 99% Physical Exam  Constitutional: She appears well-developed and well-nourished. No distress.  HENT:  Head: Normocephalic and atraumatic.  Eyes: Conjunctivae are normal. Pupils are equal, round, and reactive to light.  Cardiovascular: Normal rate, regular rhythm and normal heart sounds.   Pulmonary/Chest: Effort normal and breath sounds normal. No respiratory distress.  Abdominal: Soft. Normal appearance and bowel sounds are normal. There is tenderness in the epigastric area. There is no  CVA tenderness and no tenderness at McBurney's point.  Exquisite tenderness to epigastric region.   Musculoskeletal: She exhibits no edema or tenderness.  Lymphadenopathy:    She has no cervical adenopathy.  Neurological: She is alert.  Skin: Skin is warm and dry. She is not diaphoretic.  Nursing note and vitals reviewed.   ED Course  Procedures (including critical care time) Labs Review Labs Reviewed  CBC WITH DIFFERENTIAL/PLATELET - Abnormal; Notable for the following:    Lymphs Abs  5.1 (*)    All other components within normal limits  COMPREHENSIVE METABOLIC PANEL - Abnormal; Notable for the following:    Glucose, Bld 102 (*)    All other components within normal limits  LIPASE, BLOOD  URINALYSIS, ROUTINE W REFLEX MICROSCOPIC (NOT AT [facility])  PREGNANCY, URINE    Imaging Review Ct Abdomen Pelvis W Contrast  05/06/2015  CLINICAL DATA:  Five day history of epigastric region pain EXAM: CT ABDOMEN AND PELVIS WITH CONTRAST TECHNIQUE: Multidetector CT imaging of the abdomen and pelvis was performed using the standard protocol following bolus administration of intravenous contrast. Oral contrast was also administered. CONTRAST:  25mL OMNIPAQUE IOHEXOL 300 MG/ML SOLN, 50mL OMNIPAQUE IOHEXOL 300 MG/ML SOLN, 50mL OMNIPAQUE IOHEXOL 300 MG/ML SOLN COMPARISON:  August 10, 2011 FINDINGS: Lower chest: There is mild atelectasis in the medial segment right middle lobe anteriorly. Lung bases otherwise are clear. Hepatobiliary: No focal liver lesions are identified. Gallbladder is absent. There is no appreciable biliary duct dilatation. Pancreas: There is no mass or inflammatory focus. Spleen: No splenic lesions are identified. Adrenals/Urinary Tract: Adrenals appear normal bilaterally. There is a stable 5 mm probable cyst in the mid right kidney, unchanged. There is no hydronephrosis on either side. There is no renal or ureteral calculus on either side. Urinary bladder is midline with wall thickness within normal limits. Stomach/Bowel: There is fairly diffuse stool throughout the colon. There is no bowel obstruction. No free air or portal venous air. There is no appreciable bowel wall or mesenteric thickening. Vascular/Lymphatic: There is no abdominal aortic aneurysm. There are scattered foci of atherosclerotic calcification in the aorta. Major mesenteric arteries appear patent. There is no adenopathy in the abdomen or pelvis. Reproductive: Uterus is anteverted. There is no pelvic mass or pelvic  fluid collection. Other: Appendix appears normal. No abscess or ascites in the abdomen or pelvis. Musculoskeletal: There is a stable presumed bone island in the L3 vertebral body. There is degenerative change at L5-S1. No lytic or destructive bone lesions are identified. No intramuscular or abdominal wall lesion. IMPRESSION: Fairly diffuse stool throughout the colon. No bowel obstruction or bowel wall thickening. Appendix appears normal.  No abscess. No renal or ureteral calculus.  No hydronephrosis. Gallbladder absent. Electronically Signed   By: Bretta Bang III M.D.   On: 05/06/2015 19:56   I have personally reviewed and evaluated these images and lab results as part of my medical decision-making.   EKG Interpretation None      MDM   Final diagnoses:  Epigastric pain  Gastroesophageal reflux disease, esophagitis presence not specified    Urban Gibson presents with epigastric pain for the last week.  Findings and plan of care discussed with Raeford Razor, MD.  Suspect GERD versus pancreatitis. Patient has not been taking her omeprazole long enough for it to have the proper effect. Pt noted incidentally to be hypertensive. Pt states she does not take any HTN meds, because they all made her sick. Pt is asymptomatic to her HTN at  this time.  No significant abnormalities found on the patient's labs. This includes a normal lipase. Suspect GERD at this time.  Upon reassessment, pt states she feels much better after the protonix and pepcid. Pt was instructed to take her omeprazole twice a day and we will add pepcid for the next few days. This full plan of care was communicated to the patient, who voiced understanding, agreed to the plan, and is comfortable with discharge.  Filed Vitals:   05/06/15 1817  BP: 171/91  Pulse: 76  Temp: 98.4 F (36.9 C)  TempSrc: Oral  Resp: 18  Height: 5\' 5"  (1.651 m)  Weight: 95.709 kg  SpO2: 99%    Anselm PancoastShawn C Joy, PA-C 05/06/15 2009  Raeford RazorStephen  Kohut, MD 05/15/15 [phone]

## 2015-05-06 NOTE — ED Notes (Signed)
PA at bedside.

## 2015-05-06 NOTE — ED Notes (Signed)
Pt reports epigastric pain that started 5 days ago and radiates into throat and causes headache.  Pt reports went to PCP and was prescribed a medication but unsure what it is.

## 2015-06-18 NOTE — Telephone Encounter (Signed)
Finished

## 2017-09-17 ENCOUNTER — Other Ambulatory Visit: Payer: Self-pay

## 2017-09-17 ENCOUNTER — Emergency Department (HOSPITAL_BASED_OUTPATIENT_CLINIC_OR_DEPARTMENT_OTHER)
Admission: EM | Admit: 2017-09-17 | Discharge: 2017-09-18 | Disposition: A | Discharge status: Home / Self Care | Payer: BLUE CROSS/BLUE SHIELD | Attending: Emergency Medicine | Admitting: Emergency Medicine

## 2017-09-17 ENCOUNTER — Encounter (HOSPITAL_BASED_OUTPATIENT_CLINIC_OR_DEPARTMENT_OTHER): Payer: Self-pay | Admitting: Adult Health

## 2017-09-17 DIAGNOSIS — Z79899 Other long term (current) drug therapy: Secondary | ICD-10-CM | POA: Diagnosis not present

## 2017-09-17 DIAGNOSIS — R1013 Epigastric pain: Secondary | ICD-10-CM | POA: Diagnosis present

## 2017-09-17 DIAGNOSIS — K2970 Gastritis, unspecified, without bleeding: Secondary | ICD-10-CM | POA: Diagnosis not present

## 2017-09-17 DIAGNOSIS — I10 Essential (primary) hypertension: Secondary | ICD-10-CM | POA: Diagnosis not present

## 2017-09-17 LAB — CBC WITH DIFFERENTIAL/PLATELET
BASOS ABS: 0 10*3/uL (ref 0.0–0.1)
Basophils Relative: 0 %
Eosinophils Absolute: 0.1 10*3/uL (ref 0.0–0.7)
Eosinophils Relative: 1 %
HCT: 42.9 % (ref 36.0–46.0)
Hemoglobin: 14.1 g/dL (ref 12.0–15.0)
Lymphocytes Relative: 47 %
Lymphs Abs: 4.5 10*3/uL — ABNORMAL HIGH (ref 0.7–4.0)
MCH: 27.1 pg (ref 26.0–34.0)
MCHC: 32.9 g/dL (ref 30.0–36.0)
MCV: 82.5 fL (ref 78.0–100.0)
MONO ABS: 0.5 10*3/uL (ref 0.1–1.0)
Monocytes Relative: 5 %
Neutro Abs: 4.4 10*3/uL (ref 1.7–7.7)
Neutrophils Relative %: 47 %
Platelets: 284 10*3/uL (ref 150–400)
RBC: 5.2 MIL/uL — ABNORMAL HIGH (ref 3.87–5.11)
RDW: 13.5 % (ref 11.5–15.5)
WBC: 9.5 10*3/uL (ref 4.0–10.5)

## 2017-09-17 LAB — COMPREHENSIVE METABOLIC PANEL
ALK PHOS: 88 U/L (ref 38–126)
ALT: 25 U/L (ref 14–54)
AST: 29 U/L (ref 15–41)
Albumin: 4.5 g/dL (ref 3.5–5.0)
Anion gap: 11 (ref 5–15)
BUN: 14 mg/dL (ref 6–20)
CO2: 23 mmol/L (ref 22–32)
Calcium: 9.3 mg/dL (ref 8.9–10.3)
Chloride: 105 mmol/L (ref 101–111)
Creatinine, Ser: 0.69 mg/dL (ref 0.44–1.00)
GLUCOSE: 93 mg/dL (ref 65–99)
POTASSIUM: 3.8 mmol/L (ref 3.5–5.1)
Sodium: 139 mmol/L (ref 135–145)
Total Bilirubin: 0.9 mg/dL (ref 0.3–1.2)
Total Protein: 8 g/dL (ref 6.5–8.1)

## 2017-09-17 LAB — LIPASE, BLOOD: Lipase: 36 U/L (ref 11–51)

## 2017-09-17 MED ORDER — SUCRALFATE 1 GM/10ML PO SUSP: 1.0000 g | Freq: Three times a day (TID) | ORAL | 0 refills | Status: DC

## 2017-09-17 MED ORDER — FAMOTIDINE 20 MG PO TABS: 20.0000 mg | ORAL_TABLET | Freq: Two times a day (BID) | ORAL | 0 refills | Status: DC

## 2017-09-17 MED ORDER — GI COCKTAIL ~~LOC~~
30.0000 mL | Freq: Once | ORAL | Status: AC
  Administered 2017-09-17: 30 mL via ORAL
  Filled 2017-09-17: qty 30

## 2017-09-17 MED ORDER — KETOROLAC TROMETHAMINE 30 MG/ML IJ SOLN
30.0000 mg | Freq: Once | INTRAMUSCULAR | Status: AC
  Administered 2017-09-17: 30 mg via INTRAVENOUS
  Filled 2017-09-17: qty 1

## 2017-09-17 NOTE — ED Triage Notes (Signed)
Presents with pain in her spigastric that raidiates into her esophagus and feels like food is stuck in her throat. It has been ongoing for a few months. SHe is tqaking omeprazole with no relief. She is also trying aloe and papaya with no relief. She states that it worries her.

## 2017-09-17 NOTE — ED Provider Notes (Signed)
[facility] EMERGENCY DEPARTMENT Provider Note   CSN: [id number] Arrival date & time: 09/17/17  1753     History   Chief Complaint Chief Complaint  Patient presents with  . Abdominal Pain    HPI Christina [name] is a 57 y.o. female with a past medical history of hypertension, who presents to ED for evaluation of one-week history of epigastric discomfort, feeling of heartburn and feeling like there is food stuck in her throat.  She has tried omeprazole with only mild improvement in her symptoms.  She has had a history of similar symptoms in the past.  She reports nausea but denies any vomiting.  Denies any coughing, chest pain, hematemesis, changes in bowel movements, fever, shortness of breath.  HPI  Past Medical History:  Diagnosis Date  . Hypertension     Patient Active Problem List   Diagnosis Date Noted  . Plantar fasciitis, left 01/08/2015  . AD (atopic dermatitis) 07/30/2014  . Left ankle sprain 12/30/2013  . Right foot injury 07/10/2013  . Pancreatitis, acute 11/02/2012  . Chest pain 11/01/2012  . Hypertension, accelerated 11/01/2012  . HYPERLIPIDEMIA 09/29/2008  . HYPERTENSION 09/29/2008    Past Surgical History:  Procedure Laterality Date  . CESAREAN SECTION    . CHOLECYSTECTOMY       OB History   None      Home Medications    Prior to Admission medications   Medication Sig Start Date End Date Taking? Authorizing Provider  famotidine (PEPCID) 20 MG tablet Take 1 tablet (20 mg total) by mouth 2 (two) times daily. 09/17/17   Selby Foisy, PA-C  ibuprofen (ADVIL,MOTRIN) 200 MG tablet Take 400 mg by mouth every 6 (six) hours as needed for moderate pain.    [provider]  omeprazole (PRILOSEC) 20 MG capsule Take 2 capsules (40 mg total) by mouth 2 (two) times daily before a meal. 05/06/15   Joy, Shawn C, PA-C  sucralfate (CARAFATE) 1 GM/10ML suspension Take 10 mLs (1 g total) by mouth 4 (four) times daily -  with meals and at bedtime.  09/17/17   Dietrich PatesKhatri, Thaddeaus Monica, PA-C    Family History Family History  Problem Relation Age of Onset  . CAD Father   . Hypertension Father   . Diabetes Mother   . Hypertension Mother   . Diabetes Sister   . Hypertension Sister   . Diabetes Brother   . Hypertension Brother   . Hyperlipidemia Neg Hx     Social History Social History   Tobacco Use  . Smoking status: Never Smoker  . Smokeless tobacco: Never Used  Substance Use Topics  . Alcohol use: No    Alcohol/week: 0.0 oz  . Drug use: No     Allergies   Morphine and related and Buprenorphine hcl   Review of Systems Review of Systems  Constitutional: Negative for appetite change, chills and fever.  HENT: Negative for ear pain, rhinorrhea, sneezing and sore throat.   Eyes: Negative for photophobia and visual disturbance.  Respiratory: Negative for cough, chest tightness, shortness of breath and wheezing.   Cardiovascular: Negative for chest pain and palpitations.  Gastrointestinal: Positive for abdominal pain and nausea. Negative for blood in stool, constipation, diarrhea and vomiting.  Genitourinary: Negative for dysuria, hematuria and urgency.  Musculoskeletal: Negative for myalgias.  Skin: Negative for rash.  Neurological: Negative for dizziness, weakness and light-headedness.     Physical Exam Updated Vital Signs BP (!) 159/83 (BP Location: Right Arm)   Pulse 74  Temp 98.6 F (37 C) (Oral)   Resp 18   Ht 5\' 4"  (1.626 m)   Wt 90.7 kg (200 lb)   SpO2 100%   BMI 34.33 kg/m   Physical Exam  Constitutional: She appears well-developed and well-nourished. No distress.  Nontoxic appearing and in no acute distress.  Speaking in complete sentences without difficulty.  Pleasant.  HENT:  Head: Normocephalic and atraumatic.  Nose: Nose normal.  Eyes: Conjunctivae and EOM are normal. Left eye exhibits no discharge. No scleral icterus.  Neck: Normal range of motion. Neck supple.  Cardiovascular: Normal rate, regular  rhythm, normal heart sounds and intact distal pulses. Exam reveals no gallop and no friction rub.  No murmur heard. Pulmonary/Chest: Effort normal and breath sounds normal. No respiratory distress.  Abdominal: Soft. Bowel sounds are normal. She exhibits no distension. There is tenderness. There is no guarding.  Mild tenderness to palpation in the epigastric area with no rebound or guarding noted.  Musculoskeletal: Normal range of motion. She exhibits no edema.  Neurological: She is alert. She exhibits normal muscle tone. Coordination normal.  Skin: Skin is warm and dry. No rash noted.  Psychiatric: She has a normal mood and affect.  Nursing note and vitals reviewed.    ED Treatments / Results  Labs (all labs ordered are listed, but only abnormal results are displayed) Labs Reviewed  CBC WITH DIFFERENTIAL/PLATELET - Abnormal; Notable for the following components:      Result Value   RBC 5.20 (*)    Lymphs Abs 4.5 (*)    All other components within normal limits  COMPREHENSIVE METABOLIC PANEL  LIPASE, BLOOD    EKG None  Radiology No results found.  Procedures Procedures (including critical care time)  Medications Ordered in ED Medications  ketorolac (TORADOL) 30 MG/ML injection 30 mg (30 mg Intravenous Given 09/17/17 2208)  gi cocktail (Maalox,Lidocaine,Donnatal) (30 mLs Oral Given 09/17/17 2207)     Initial Impression / Assessment and Plan / ED Course  I have reviewed the triage vital signs and the nursing notes.  Pertinent labs & imaging results that were available during my care of the patient were reviewed by me and considered in my medical decision making (see chart for details).     She presents to ED for evaluation of epigastric abdominal pain feeling like there is food stuck in her throat.  It has been going on intermittently for 1 week.  She tried omeprazole with only mild improvement in her symptoms.  On physical exam she does have some epigastric abdominal  tenderness to palpation with no rebound or guarding noted.  She is overall well-appearing and does not appear in distress.  She denies any vomiting, hematemesis, chest pain or shortness of breath.  Lab work including CBC, BMP, lipase unremarkable.  She reports significant improvement in her symptoms with Toradol and GI cocktail given here.  Suspect that her symptoms are due to gastritis rather than infectious or surgical cause of her symptoms.  Will refer her to GI for further evaluation and advised her to take Pepcid, Carafate and follow-up with PCP.  Advised to return for any severe worsening symptoms.  Portions of this note were generated with Scientist, clinical (histocompatibility and immunogenetics). Dictation errors may occur despite best attempts at proofreading.   Final Clinical Impressions(s) / ED Diagnoses   Final diagnoses:  Gastritis without bleeding, unspecified chronicity, unspecified gastritis type    ED Discharge Orders        Ordered    famotidine (PEPCID)  20 MG tablet  2 times daily     09/17/17 2249    sucralfate (CARAFATE) 1 GM/10ML suspension  3 times daily with meals & bedtime     09/17/17 2249       Dietrich Pates, PA-C 09/17/17 2252    Rolan Bucco, MD 09/17/17 2311

## 2017-09-22 ENCOUNTER — Other Ambulatory Visit: Payer: Self-pay

## 2017-09-22 ENCOUNTER — Emergency Department (HOSPITAL_BASED_OUTPATIENT_CLINIC_OR_DEPARTMENT_OTHER)
Admission: EM | Admit: 2017-09-22 | Discharge: 2017-09-22 | Disposition: A | Discharge status: Home / Self Care | Payer: BLUE CROSS/BLUE SHIELD | Attending: Emergency Medicine | Admitting: Emergency Medicine

## 2017-09-22 ENCOUNTER — Encounter (HOSPITAL_BASED_OUTPATIENT_CLINIC_OR_DEPARTMENT_OTHER): Payer: Self-pay | Admitting: Emergency Medicine

## 2017-09-22 DIAGNOSIS — Z79899 Other long term (current) drug therapy: Secondary | ICD-10-CM | POA: Insufficient documentation

## 2017-09-22 DIAGNOSIS — Z9049 Acquired absence of other specified parts of digestive tract: Secondary | ICD-10-CM | POA: Diagnosis not present

## 2017-09-22 DIAGNOSIS — I10 Essential (primary) hypertension: Secondary | ICD-10-CM | POA: Diagnosis not present

## 2017-09-22 DIAGNOSIS — K219 Gastro-esophageal reflux disease without esophagitis: Secondary | ICD-10-CM

## 2017-09-22 DIAGNOSIS — R079 Chest pain, unspecified: Secondary | ICD-10-CM | POA: Diagnosis present

## 2017-09-22 HISTORY — DX: Gastro-esophageal reflux disease without esophagitis: K21.9

## 2017-09-22 MED ORDER — GI COCKTAIL ~~LOC~~
ORAL | Status: AC
  Administered 2017-09-22: 30 mL via ORAL
  Filled 2017-09-22: qty 30

## 2017-09-22 MED ORDER — GI COCKTAIL ~~LOC~~
30.0000 mL | Freq: Once | ORAL | Status: AC
  Administered 2017-09-22: 30 mL via ORAL

## 2017-09-22 NOTE — ED Provider Notes (Signed)
[facility] Provider Note: Lowella DellJ. Lane Orlandis Sanden, MD, FACEP  CSN: [id number] MRN: [id number] ARRIVAL: 09/22/17 at 0223 ROOM: [facility]   CHIEF COMPLAINT  Gastroesophageal Reflux   HISTORY OF PRESENT ILLNESS  09/22/17 3:34 AM Christina [name] is a 57 y.o. female who has been having chest discomfort for about the past week.  She was recently diagnosed with gastroesophageal reflux and is currently taking Prilosec, Pepcid and Carafate liquid.  She states she is not always able to take her Carafate when she is at work.  She has no difficulty eating but after she eats she has a sensation of food being stuck in her esophagus with an associated burning sensation.  She localizes the sensation to her substernal region and throat.  Symptoms are moderate.  She denies vomiting or shortness of breath.  She is requesting a GI cocktail and a referral to gastroenterology.   Past Medical History:  Diagnosis Date  . Hypertension     Past Surgical History:  Procedure Laterality Date  . CESAREAN SECTION    . CHOLECYSTECTOMY      Family History  Problem Relation Age of Onset  . CAD Father   . Hypertension Father   . Diabetes Mother   . Hypertension Mother   . Diabetes Sister   . Hypertension Sister   . Diabetes Brother   . Hypertension Brother   . Hyperlipidemia Neg Hx     Social History   Tobacco Use  . Smoking status: Never Smoker  . Smokeless tobacco: Never Used  Substance Use Topics  . Alcohol use: No    Alcohol/week: 0.0 oz  . Drug use: No    Prior to Admission medications   Medication Sig Start Date End Date Taking? Authorizing Provider  famotidine (PEPCID) 20 MG tablet Take 1 tablet (20 mg total) by mouth 2 (two) times daily. 09/17/17   Khatri, Hina, PA-C  ibuprofen (ADVIL,MOTRIN) 200 MG tablet Take 400 mg by mouth every 6 (six) hours as needed for moderate pain.    [provider]  omeprazole (PRILOSEC) 20 MG capsule Take 2 capsules (40 mg total) by mouth 2 (two)  times daily before a meal. 05/06/15   Joy, Shawn C, PA-C  sucralfate (CARAFATE) 1 GM/10ML suspension Take 10 mLs (1 g total) by mouth 4 (four) times daily -  with meals and at bedtime. 09/17/17   Khatri, Hina, PA-C    Allergies Morphine and related and Buprenorphine hcl   REVIEW OF SYSTEMS  Negative except as noted here or in the History of Present Illness.   PHYSICAL EXAMINATION  Initial Vital Signs Blood pressure (!) 198/111, pulse 85, temperature 98.3 F (36.8 C), temperature source Oral, resp. rate 16, height 5\' 4"  (1.626 m), weight 90.7 kg (200 lb), SpO2 100 %.  Examination General: Well-developed, well-nourished female in no acute distress; appearance consistent with age of record HENT: normocephalic; atraumatic Eyes: pupils equal, round and reactive to light; extraocular muscles intact Neck: supple Heart: regular rate and rhythm Lungs: clear to auscultation bilaterally Abdomen: soft; nondistended; nontender; bowel sounds present Extremities: No deformity; full range of motion; pulses normal Neurologic: Awake, alert and oriented; motor function intact in all extremities and symmetric; no facial droop Skin: Warm and dry Psychiatric: Normal mood and affect   RESULTS  Summary of this visit's results, reviewed by myself:   EKG Interpretation  Date/Time:    Ventricular Rate:    PR Interval:    QRS Duration:   QT Interval:  QTC Calculation:   R Axis:     Text Interpretation:        Laboratory Studies: No results found for this or any previous visit (from the past 24 hour(s)). Imaging Studies: No results found.  ED COURSE  Nursing notes and initial vitals signs, including pulse oximetry, reviewed.  Vitals:   09/22/17 0230 09/22/17 0232  BP: (!) 198/111   Pulse: 85   Resp: 16   Temp: 98.3 F (36.8 C)   TempSrc: Oral   SpO2: 100%   Weight:  90.7 kg (200 lb)  Height:  5\' 4"  (1.626 m)    PROCEDURES    ED DIAGNOSES     ICD-10-CM   1.  Gastroesophageal reflux disease without esophagitis K21.9        Kacey Vicuna, MD 09/22/17 [phone]

## 2017-09-22 NOTE — ED Triage Notes (Addendum)
Pt c/o feeling like she has food stuck in throat. Pt was seen by her PMD yesterday for same and states the medicines they gave her didn't work. Pt was dx with GERD. Pt describes discomfort as burning in chest and throat.

## 2017-09-27 ENCOUNTER — Other Ambulatory Visit: Payer: Self-pay | Admitting: Physician Assistant

## 2017-09-27 DIAGNOSIS — K219 Gastro-esophageal reflux disease without esophagitis: Secondary | ICD-10-CM

## 2017-10-02 ENCOUNTER — Ambulatory Visit
Admission: RE | Admit: 2017-10-02 | Discharge: 2017-10-02 | Disposition: A | Discharge status: Home / Self Care | Payer: BLUE CROSS/BLUE SHIELD | Source: Ambulatory Visit | Attending: Physician Assistant | Admitting: Physician Assistant

## 2017-10-02 DIAGNOSIS — K219 Gastro-esophageal reflux disease without esophagitis: Secondary | ICD-10-CM

## 2018-04-10 ENCOUNTER — Encounter: Payer: Self-pay | Admitting: Family Medicine

## 2018-04-10 ENCOUNTER — Ambulatory Visit (INDEPENDENT_AMBULATORY_CARE_PROVIDER_SITE_OTHER): Payer: BLUE CROSS/BLUE SHIELD | Admitting: Family Medicine

## 2018-04-10 VITALS — BP 182/76 | HR 70 | Ht 65.0 in | Wt 197.0 lb

## 2018-04-10 DIAGNOSIS — M722 Plantar fascial fibromatosis: Secondary | ICD-10-CM

## 2018-04-10 DIAGNOSIS — R202 Paresthesia of skin: Secondary | ICD-10-CM | POA: Diagnosis not present

## 2018-04-10 DIAGNOSIS — M7662 Achilles tendinitis, left leg: Secondary | ICD-10-CM

## 2018-04-10 NOTE — Progress Notes (Signed)
PCP: Wilburn Mylar, MD  Subjective:   HPI: Patient is a 57 y.o. female here for left ankle/foot pain.  Patient reports 1 to 2 weeks of 7/10 pain in her left foot.  She localizes her pain to the medial aspect of the calcaneus as well as around the Achilles.  Her pain is worse with walking.  Improves with rest.  She has a history of plantar fasciitis in the past for which she was seen about 3 years ago.  At that time her symptoms improved significantly with home exercises and arch binders.  No swelling, erythema, bruising.  Patient also describes bilateral feeling of deep tingling.  It initially started in the left leg and after a few days involve the right leg as well.  The sensation is circumferential but located between the knee and the ankle.  There is not much involvement of the feet.  She has been experiencing the sensation for about 4 days.  Describes this like ants are crawling in the area, worse at night.   Past Medical History:  Diagnosis Date  . GERD (gastroesophageal reflux disease)   . Hypertension     Current Outpatient Medications on File Prior to Visit  Medication Sig Dispense Refill  . famotidine (PEPCID) 20 MG tablet Take 1 tablet (20 mg total) by mouth 2 (two) times daily. 30 tablet 0  . omeprazole (PRILOSEC) 20 MG capsule Take 2 capsules (40 mg total) by mouth 2 (two) times daily before a meal. 60 capsule 2  . sucralfate (CARAFATE) 1 GM/10ML suspension Take 10 mLs (1 g total) by mouth 4 (four) times daily -  with meals and at bedtime. 420 mL 0   No current facility-administered medications on file prior to visit.     Past Surgical History:  Procedure Laterality Date  . CESAREAN SECTION    . CHOLECYSTECTOMY      Allergies  Allergen Reactions  . Morphine And Related Itching  . Buprenorphine Hcl Itching    Social History   Socioeconomic History  . Marital status: Married    Spouse name: Not on file  . Number of children: Not on file  . Years of education:  Not on file  . Highest education level: Not on file  Occupational History  . Not on file  Social Needs  . Financial resource strain: Not on file  . Food insecurity:    Worry: Not on file    Inability: Not on file  . Transportation needs:    Medical: Not on file    Non-medical: Not on file  Tobacco Use  . Smoking status: Never Smoker  . Smokeless tobacco: Never Used  Substance and Sexual Activity  . Alcohol use: No    Alcohol/week: 0.0 standard drinks  . Drug use: No  . Sexual activity: Not on file  Lifestyle  . Physical activity:    Days per week: Not on file    Minutes per session: Not on file  . Stress: Not on file  Relationships  . Social connections:    Talks on phone: Not on file    Gets together: Not on file    Attends religious service: Not on file    Active member of club or organization: Not on file    Attends meetings of clubs or organizations: Not on file    Relationship status: Not on file  . Intimate partner violence:    Fear of current or ex partner: Not on file    Emotionally  abused: Not on file    Physically abused: Not on file    Forced sexual activity: Not on file  Other Topics Concern  . Not on file  Social History Narrative  . Not on file    Family History  Problem Relation Age of Onset  . CAD Father   . Hypertension Father   . Diabetes Mother   . Hypertension Mother   . Diabetes Sister   . Hypertension Sister   . Diabetes Brother   . Hypertension Brother   . Hyperlipidemia Neg Hx     There were no vitals taken for this visit.  Review of Systems: See HPI above.     Objective:  Physical Exam:  Gen: awake, alert, NAD, comfortable in exam room Pulm: breathing unlabored  Left ankle: - Inspection: No obvious deformity, erythema, swelling, or ecchymosis - Palpation: Tenderness over the medial calcaneal tubercle and plantar fascia near its insertion on the calcaneus.  There is also tenderness posteriorly over the Achilles tendon more  proximally near the musculotendinous junction. - Strength: Normal strength with dorsiflexion, plantarflexion, inversion, and eversion - ROM: Full ROM - Neuro/vasc: NV intact - Special Tests: Negative anterior drawer, normal inversion test.  Negative syndesmotic compression.  Right ankle: No obvious deformity No tenderness over the plantar fascia or Achilles Full range of motion with 5/5 strength N/V intact  Bilateral lower legs: There is no soft tissue swelling of the lower legs bilaterally.  There is no calf tenderness.  There is no calf pain with passive dorsiflexion of the ankles bilaterally.  There are no obvious skin changes present.  Dorsalis pedis pulses palpable bilaterally.    Assessment & Plan:  1.  Plantar fasciitis, left 2.  Mild Achilles tendinitis, left  - Patient provided with arch binders - Home exercises to address the Achilles tendinitis and plantar fasciitis Recommended patient by new sneakers as hers are 55 years old and very worn.  Also recommend she buy new insoles with arch support. - Tylenol or NSAIDs as needed for pain - Ice - She will follow-up in about 6 weeks  3.  Pins and needles feeling, bilateral-there is no obvious musculoskeletal cause for her bilateral lower extremity symptoms.  It is not related to any knee or ankle pathology.  There is no concerning history or physical exam findings for VTE.  No evidence of infection.  No obvious skin pathology.  It is possible this is related to an underlying peripheral nerve condition -We will order labs of BMP and B12

## 2018-04-10 NOTE — Patient Instructions (Signed)
You have plantar fasciitis Take tylenol and/or aleve as needed for pain  Plantar fascia stretch for 20-30 seconds (do 3 of these) in morning Lowering/raise on a step exercises 3 x 10 once or twice a day - this is very important for long term recovery. Can add heel walks, toe walks forward and backward as well Ice heel for 15 minutes as needed. Avoid flat shoes/barefoot walking as much as possible. Arch straps have been shown to help with pain. Inserts are important (dr. Jari Sportsman active series, spencos, our green insoles, custom orthotics). Get new shoes - I'd recommend [employer] to get fitted for a good pair ([phone]) Steroid injection is a consideration for short term pain relief if you are struggling. Physical therapy is also an option. Follow up with me in 6 weeks.  I would recommend you get bloodwork for your crawling sensation in your legs (B12, BMP) - there is a lab upstairs.

## 2018-08-27 ENCOUNTER — Ambulatory Visit (INDEPENDENT_AMBULATORY_CARE_PROVIDER_SITE_OTHER): Payer: BLUE CROSS/BLUE SHIELD | Admitting: Family Medicine

## 2018-08-27 ENCOUNTER — Encounter: Payer: Self-pay | Admitting: Family Medicine

## 2018-08-27 ENCOUNTER — Other Ambulatory Visit: Payer: Self-pay

## 2018-08-27 VITALS — BP 163/84 | HR 65 | Ht 65.0 in | Wt 200.0 lb

## 2018-08-27 DIAGNOSIS — M722 Plantar fascial fibromatosis: Secondary | ICD-10-CM | POA: Diagnosis not present

## 2018-08-27 NOTE — Patient Instructions (Addendum)
Start physical therapy and do home exercises on days you don't go to therapy. Wear the insoles when up and walking around - if this feels like too much arch support you can remove the white pads. Consider 'night splints' or 'strassburg sock' when sleeping. Arch binders during the day as well. Follow up with me in 6 weeks.

## 2018-08-27 NOTE — Progress Notes (Signed)
PCP: Wilburn Mylar, MD  Subjective:   HPI: Patient is a 58 y.o. female here for left foot pain.  Patient here for follow-up for plantar fasciitis for which she was seen 6 months ago.  At that time she was provided with a arch strap, recommend new sneakers and arch supports, and instructed on home exercises.  She intermittently wears the arch straps which are somewhat helpful.  She did buy new better supportive sneakers and insoles.  She is not very consistent with the home exercises.  Her pain is 6/10 and continues to be in the plantar aspect of the left foot near the heel.  Is worse in the morning which she describes as a stretching pain..  She denies any new injury.  No numbness or tingling.  No erythema.  Past Medical History:  Diagnosis Date  . GERD (gastroesophageal reflux disease)   . Hypertension     Current Outpatient Medications on File Prior to Visit  Medication Sig Dispense Refill  . famotidine (PEPCID) 20 MG tablet Take 1 tablet (20 mg total) by mouth 2 (two) times daily. 30 tablet 0  . lisinopril (PRINIVIL,ZESTRIL) 20 MG tablet   0  . omeprazole (PRILOSEC) 20 MG capsule Take 2 capsules (40 mg total) by mouth 2 (two) times daily before a meal. 60 capsule 2  . pantoprazole (PROTONIX) 40 MG tablet     . sucralfate (CARAFATE) 1 GM/10ML suspension Take 10 mLs (1 g total) by mouth 4 (four) times daily -  with meals and at bedtime. 420 mL 0   No current facility-administered medications on file prior to visit.     Past Surgical History:  Procedure Laterality Date  . CESAREAN SECTION    . CHOLECYSTECTOMY      Allergies  Allergen Reactions  . Buprenorphine Hcl Itching  . Iodinated Diagnostic Agents Rash    Other reaction(s): Rash   . Morphine And Related Itching    Other reaction(s): Itching     Social History   Socioeconomic History  . Marital status: Married    Spouse name: Not on file  . Number of children: Not on file  . Years of education: Not on file  .  Highest education level: Not on file  Occupational History  . Not on file  Social Needs  . Financial resource strain: Not on file  . Food insecurity:    Worry: Not on file    Inability: Not on file  . Transportation needs:    Medical: Not on file    Non-medical: Not on file  Tobacco Use  . Smoking status: Never Smoker  . Smokeless tobacco: Never Used  Substance and Sexual Activity  . Alcohol use: No    Alcohol/week: 0.0 standard drinks  . Drug use: No  . Sexual activity: Not on file  Lifestyle  . Physical activity:    Days per week: Not on file    Minutes per session: Not on file  . Stress: Not on file  Relationships  . Social connections:    Talks on phone: Not on file    Gets together: Not on file    Attends religious service: Not on file    Active member of club or organization: Not on file    Attends meetings of clubs or organizations: Not on file    Relationship status: Not on file  . Intimate partner violence:    Fear of current or ex partner: Not on file    Emotionally abused:  Not on file    Physically abused: Not on file    Forced sexual activity: Not on file  Other Topics Concern  . Not on file  Social History Narrative  . Not on file    Family History  Problem Relation Age of Onset  . CAD Father   . Hypertension Father   . Diabetes Mother   . Hypertension Mother   . Diabetes Sister   . Hypertension Sister   . Diabetes Brother   . Hypertension Brother   . Hyperlipidemia Neg Hx     BP (!) 163/84   Pulse 65   Ht 5\' 5"  (1.651 m)   Wt 200 lb (90.7 kg)   BMI 33.28 kg/m   Review of Systems: See HPI above.     Objective:  Physical Exam:  Gen: awake, alert, NAD, comfortable in exam room Pulm: breathing unlabored  Left foot: Inspection:  No obvious bony deformity.  No swelling, erythema, or bruising.  Normal arch Palpation: Tenderness around the medial calcaneus and over the plantar fascia ROM: Full ROM of the ankle. Normal midfoot  flexibility.  Mild pain reproduced with passive dorsiflexion Strength: 5/5 strength ankle in all planes Neurovascular: N/V intact distally in the lower extremity   Assessment & Plan:  1.  Plantar fasciitis, left foot, overall stable.  Today we modified her Dr. Margart Sickles insoles with the addition of a small scaphoid pad.  Recommended continued use of the arch straps.  Will refer to physical therapy for additional stretching exercises.  Recommend trial of Strassburg sock.  Follow-up in 6

## 2018-08-28 ENCOUNTER — Encounter: Payer: Self-pay | Admitting: Family Medicine

## 2018-09-03 ENCOUNTER — Ambulatory Visit: Payer: BLUE CROSS/BLUE SHIELD | Admitting: Physical Therapy

## 2018-10-01 ENCOUNTER — Ambulatory Visit: Payer: BLUE CROSS/BLUE SHIELD | Admitting: Family Medicine

## 2018-10-30 ENCOUNTER — Other Ambulatory Visit: Payer: Self-pay

## 2018-10-30 ENCOUNTER — Encounter (HOSPITAL_BASED_OUTPATIENT_CLINIC_OR_DEPARTMENT_OTHER): Payer: Self-pay | Admitting: *Deleted

## 2018-10-30 ENCOUNTER — Emergency Department (HOSPITAL_BASED_OUTPATIENT_CLINIC_OR_DEPARTMENT_OTHER)
Admission: EM | Admit: 2018-10-30 | Discharge: 2018-10-30 | Disposition: A | Discharge status: Home / Self Care | Payer: BLUE CROSS/BLUE SHIELD | Attending: Emergency Medicine | Admitting: Emergency Medicine

## 2018-10-30 DIAGNOSIS — W268XXA Contact with other sharp object(s), not elsewhere classified, initial encounter: Secondary | ICD-10-CM | POA: Insufficient documentation

## 2018-10-30 DIAGNOSIS — Y93G1 Activity, food preparation and clean up: Secondary | ICD-10-CM | POA: Insufficient documentation

## 2018-10-30 DIAGNOSIS — S01512A Laceration without foreign body of oral cavity, initial encounter: Secondary | ICD-10-CM | POA: Diagnosis not present

## 2018-10-30 DIAGNOSIS — Y998 Other external cause status: Secondary | ICD-10-CM | POA: Diagnosis not present

## 2018-10-30 DIAGNOSIS — I10 Essential (primary) hypertension: Secondary | ICD-10-CM | POA: Diagnosis not present

## 2018-10-30 DIAGNOSIS — S0993XA Unspecified injury of face, initial encounter: Secondary | ICD-10-CM | POA: Diagnosis present

## 2018-10-30 DIAGNOSIS — Y929 Unspecified place or not applicable: Secondary | ICD-10-CM | POA: Insufficient documentation

## 2018-10-30 DIAGNOSIS — Z79899 Other long term (current) drug therapy: Secondary | ICD-10-CM | POA: Diagnosis not present

## 2018-10-30 NOTE — ED Provider Notes (Signed)
[facility] EMERGENCY DEPARTMENT Provider Note   CSN: [id number] Arrival date & time: 10/30/18  1629    History   Chief Complaint Chief Complaint  Patient presents with  . Laceration    HPI Christina [name] is a 58 y.o. female.     58 year old female presents for laceration to her tongue.  Patient states that she was eating an oyster when the shelf cut her tongue.  There is no bleeding at this time, no other injuries.     Past Medical History:  Diagnosis Date  . GERD (gastroesophageal reflux disease)   . Hypertension     Patient Active Problem List   Diagnosis Date Noted  . Plantar fasciitis, left 01/08/2015  . AD (atopic dermatitis) 07/30/2014  . Left ankle sprain 12/30/2013  . Right foot injury 07/10/2013  . Pancreatitis, acute 11/02/2012  . Chest pain 11/01/2012  . Hypertension, accelerated 11/01/2012  . HYPERLIPIDEMIA 09/29/2008  . HYPERTENSION 09/29/2008    Past Surgical History:  Procedure Laterality Date  . CESAREAN SECTION    . CHOLECYSTECTOMY       OB History   No obstetric history on file.      Home Medications    Prior to Admission medications   Medication Sig Start Date End Date Taking? Authorizing Provider  famotidine (PEPCID) 20 MG tablet Take 1 tablet (20 mg total) by mouth 2 (two) times daily. 09/17/17   Khatri, Hillary BowHina, PA-C  lisinopril (PRINIVIL,ZESTRIL) 20 MG tablet  04/06/18   [provider]  omeprazole (PRILOSEC) 20 MG capsule Take 2 capsules (40 mg total) by mouth 2 (two) times daily before a meal. 05/06/15   Joy, Shawn C, PA-C  pantoprazole (PROTONIX) 40 MG tablet  02/12/18   [provider]  sucralfate (CARAFATE) 1 GM/10ML suspension Take 10 mLs (1 g total) by mouth 4 (four) times daily -  with meals and at bedtime. 09/17/17   Dietrich PatesKhatri, Hina, PA-C    Family History Family History  Problem Relation Age of Onset  . CAD Father   . Hypertension Father   . Diabetes Mother   . Hypertension Mother   . Diabetes  Sister   . Hypertension Sister   . Diabetes Brother   . Hypertension Brother   . Hyperlipidemia Neg Hx     Social History Social History   Tobacco Use  . Smoking status: Never Smoker  . Smokeless tobacco: Never Used  Substance Use Topics  . Alcohol use: No    Alcohol/week: 0.0 standard drinks  . Drug use: No     Allergies   Buprenorphine hcl; Iodinated diagnostic agents; and Morphine and related   Review of Systems Review of Systems  Constitutional: Negative for fever.  Skin: Positive for wound.  Allergic/Immunologic: Negative for immunocompromised state.  Hematological: Does not bruise/bleed easily.  All other systems reviewed and are negative.    Physical Exam Updated Vital Signs BP (!) 160/97   Pulse 77   Temp 98.1 F (36.7 C) (Oral)   Resp 18   Ht 5\' 5"  (1.651 m)   Wt 90.7 kg   SpO2 99%   BMI 33.28 kg/m   Physical Exam Vitals signs and nursing note reviewed.  Constitutional:      General: She is not in acute distress.    Appearance: She is well-developed. She is not diaphoretic.  HENT:     Head: Normocephalic and atraumatic.     Mouth/Throat:     Mouth: Mucous membranes are moist.  Pharynx: Oropharynx is clear. No oropharyngeal exudate or posterior oropharyngeal erythema.   Pulmonary:     Effort: Pulmonary effort is normal.  Neurological:     Mental Status: She is alert and oriented to person, place, and time.  Psychiatric:        Behavior: Behavior normal.      ED Treatments / Results  Labs (all labs ordered are listed, but only abnormal results are displayed) Labs Reviewed - No data to display  EKG None  Radiology No results found.  Procedures Procedures (including critical care time)  Medications Ordered in ED Medications - No data to display   Initial Impression / Assessment and Plan / ED Course  I have reviewed the triage vital signs and the nursing notes.  Pertinent labs & imaging results that were available during  my care of the patient were reviewed by me and considered in my medical decision making (see chart for details).  Clinical Course as of Oct 30 1643  Tue Oct 30, 2018  [id number] 58 year old female with complaint of laceration to her tongue after eating oyster, cut on the shelf.  On exam there is no obvious injury.  Recommend patient rinse with Listerine or salt water after every meal and avoid spicy food.   [LM]    Clinical Course User Index [LM] Jeannie Fend, PA-C      Final Clinical Impressions(s) / ED Diagnoses   Final diagnoses:  Injury of tongue, initial encounter    ED Discharge Orders    None       Jeannie Fend, PA-C 10/30/18 1645    Jacalyn Lefevre, MD 10/30/18 [phone]

## 2018-10-30 NOTE — ED Notes (Signed)
Cut tongue while eating,  Bleeding controlled

## 2018-10-30 NOTE — ED Triage Notes (Signed)
She was eating an oyster shell and it cut her tongue. Bleeding controlled.

## 2018-10-30 NOTE — Discharge Instructions (Addendum)
Rinse with salt water or Listerine after every meal. Bland diet, avoid spicy foods.

## 2019-01-05 ENCOUNTER — Emergency Department (HOSPITAL_BASED_OUTPATIENT_CLINIC_OR_DEPARTMENT_OTHER)
Admission: EM | Admit: 2019-01-05 | Discharge: 2019-01-05 | Disposition: A | Discharge status: Home / Self Care | Payer: BC Managed Care – PPO | Attending: Emergency Medicine | Admitting: Emergency Medicine

## 2019-01-05 ENCOUNTER — Emergency Department (HOSPITAL_BASED_OUTPATIENT_CLINIC_OR_DEPARTMENT_OTHER): Payer: BC Managed Care – PPO

## 2019-01-05 ENCOUNTER — Encounter (HOSPITAL_BASED_OUTPATIENT_CLINIC_OR_DEPARTMENT_OTHER): Payer: Self-pay | Admitting: *Deleted

## 2019-01-05 ENCOUNTER — Other Ambulatory Visit: Payer: Self-pay

## 2019-01-05 DIAGNOSIS — M5442 Lumbago with sciatica, left side: Secondary | ICD-10-CM | POA: Diagnosis not present

## 2019-01-05 DIAGNOSIS — M25552 Pain in left hip: Secondary | ICD-10-CM | POA: Diagnosis present

## 2019-01-05 DIAGNOSIS — I10 Essential (primary) hypertension: Secondary | ICD-10-CM | POA: Insufficient documentation

## 2019-01-05 DIAGNOSIS — Z79899 Other long term (current) drug therapy: Secondary | ICD-10-CM | POA: Insufficient documentation

## 2019-01-05 LAB — URINALYSIS, MICROSCOPIC (REFLEX): WBC, UA: NONE SEEN WBC/hpf (ref 0–5)

## 2019-01-05 LAB — PREGNANCY, URINE: Preg Test, Ur: NEGATIVE

## 2019-01-05 LAB — URINALYSIS, ROUTINE W REFLEX MICROSCOPIC
Bilirubin Urine: NEGATIVE
Glucose, UA: NEGATIVE mg/dL
Ketones, ur: NEGATIVE mg/dL
Leukocytes,Ua: NEGATIVE
Nitrite: NEGATIVE
Protein, ur: NEGATIVE mg/dL
Specific Gravity, Urine: >1.03 — ABNORMAL HIGH (ref 1.005–1.030)
pH: 5.5 (ref 5.0–8.0)

## 2019-01-05 MED ORDER — LIDOCAINE 5 % EX PTCH: 1.0000 | MEDICATED_PATCH | CUTANEOUS | 0 refills | Status: DC

## 2019-01-05 MED ORDER — PREDNISONE 20 MG PO TABS: 40.0000 mg | ORAL_TABLET | Freq: Every day | ORAL | 0 refills | Status: DC

## 2019-01-05 MED ORDER — HYDROCODONE-ACETAMINOPHEN 5-325 MG PO TABS: 1.0000 | ORAL_TABLET | Freq: Four times a day (QID) | ORAL | 0 refills | Status: DC | PRN

## 2019-01-05 MED ORDER — ACETAMINOPHEN 500 MG PO TABS
1000.0000 mg | ORAL_TABLET | Freq: Once | ORAL | Status: AC
  Administered 2019-01-05: 1000 mg via ORAL
  Filled 2019-01-05: qty 2

## 2019-01-05 MED ORDER — PREDNISONE 20 MG PO TABS: 40.0000 mg | ORAL_TABLET | Freq: Every day | ORAL | 0 refills | Status: AC

## 2019-01-05 NOTE — ED Triage Notes (Signed)
Pt reports pain in left hip radiating into her left leg x 3 days. Denies injury

## 2019-01-05 NOTE — ED Notes (Signed)
Waiting for urine preg test to perform xray after speaking to PA

## 2019-01-05 NOTE — Discharge Instructions (Addendum)
Take the medicines as prescribed.  Follow-up with PCP if you have unresolved symptoms.  Do not drive, operate heavy machinery while taking this medicine.  Of note the narcotic prescription may become addictive.  Return to the ED for any worsening symptoms.

## 2019-01-05 NOTE — ED Provider Notes (Addendum)
[facility] EMERGENCY DEPARTMENT Provider Note   CSN: [id number] Arrival date & time: 01/05/19  1356   History   Chief Complaint Chief Complaint  Patient presents with  . Leg Pain   HPI Christina [name] is a 58 y.o. female with past history significant for hypertension, hyperlipidemia who presents for evaluation of left-sided back and leg pain.  Symptom onset 3 days ago.  Patient states she has pain which radiates down her posterior buttocks and posterior left leg.  Pain stops at her knee.  Pain worse with walking and movement.  She denies recent injury or trauma.  Has been taking ibuprofen without relief of her pain.  She rates her current pain a 7/10.  Described as sharp and aching.  Denies history of IVDU, bowel or bladder incontinence, saddle paresthesia, history of malignancy, history of chronic steroid use.  Denies any additional aggravating or alleviating factors.   History obtained from patient and past medical records.  No diabetes.     HPI  Past Medical History:  Diagnosis Date  . GERD (gastroesophageal reflux disease)   . Hypertension     Patient Active Problem List   Diagnosis Date Noted  . Plantar fasciitis, left 01/08/2015  . AD (atopic dermatitis) 07/30/2014  . Left ankle sprain 12/30/2013  . Right foot injury 07/10/2013  . Pancreatitis, acute 11/02/2012  . Chest pain 11/01/2012  . Hypertension, accelerated 11/01/2012  . HYPERLIPIDEMIA 09/29/2008  . HYPERTENSION 09/29/2008    Past Surgical History:  Procedure Laterality Date  . CESAREAN SECTION    . CHOLECYSTECTOMY       OB History   No obstetric history on file.      Home Medications    Prior to Admission medications   Medication Sig Start Date End Date Taking? Authorizing Provider  famotidine (PEPCID) 20 MG tablet Take 1 tablet (20 mg total) by mouth 2 (two) times daily. 09/17/17   Khatri, Hina, PA-C  HYDROcodone-acetaminophen (NORCO/VICODIN) 5-325 MG tablet Take 1 tablet by mouth  every 6 (six) hours as needed for severe pain. 01/05/19   Sherena Machorro A, PA-C  lidocaine (LIDODERM) 5 % Place 1 patch onto the skin daily. Remove & Discard patch within 12 hours or as directed by MD 01/05/19   Ranesha Val A, PA-C  lisinopril (PRINIVIL,ZESTRIL) 20 MG tablet  04/06/18   [provider]  omeprazole (PRILOSEC) 20 MG capsule Take 2 capsules (40 mg total) by mouth 2 (two) times daily before a meal. 05/06/15   Joy, Shawn C, PA-C  pantoprazole (PROTONIX) 40 MG tablet  02/12/18   [provider]  predniSONE (DELTASONE) 20 MG tablet Take 2 tablets (40 mg total) by mouth daily for 5 days. 01/05/19 01/10/19  Aretta Stetzel A, PA-C  sucralfate (CARAFATE) 1 GM/10ML suspension Take 10 mLs (1 g total) by mouth 4 (four) times daily -  with meals and at bedtime. 09/17/17   Delia Heady, PA-C    Family History Family History  Problem Relation Age of Onset  . CAD Father   . Hypertension Father   . Diabetes Mother   . Hypertension Mother   . Diabetes Sister   . Hypertension Sister   . Diabetes Brother   . Hypertension Brother   . Hyperlipidemia Neg Hx     Social History Social History   Tobacco Use  . Smoking status: Never Smoker  . Smokeless tobacco: Never Used  Substance Use Topics  . Alcohol use: Not Currently    Alcohol/week: 0.0 standard  drinks  . Drug use: No     Allergies   Buprenorphine hcl, Iodinated diagnostic agents, and Morphine and related   Review of Systems Review of Systems  Constitutional: Negative.   HENT: Negative.   Respiratory: Negative.   Cardiovascular: Negative.   Gastrointestinal: Negative.   Genitourinary: Negative.   Musculoskeletal: Positive for back pain.  Skin: Negative.   Neurological: Negative.   All other systems reviewed and are negative.    Physical Exam Updated Vital Signs BP (!) 166/83 (BP Location: Left Arm)   Pulse 72   Temp 99 F (37.2 C) (Oral)   Resp 18   Ht 5\' 5"  (1.651 m)   Wt 96.2 kg   SpO2  99%   BMI 35.28 kg/m   Physical Exam  Physical Exam  Constitutional: Pt appears well-developed and well-nourished. No distress.  HENT:  Head: Normocephalic and atraumatic.  Mouth/Throat: Oropharynx is clear and moist. No oropharyngeal exudate.  Eyes: Conjunctivae are normal.  Neck: Normal range of motion. Neck supple.  Full ROM without pain  Cardiovascular: Normal rate, regular rhythm and intact distal pulses.   Pulmonary/Chest: Effort normal and breath sounds normal. No respiratory distress. Pt has no wheezes.  Abdominal: Soft. Pt exhibits no distension. There is no tenderness, rebound or guarding. No abd bruit or pulsatile mass Musculoskeletal:  Full range of motion of the T-spine and L-spine with flexion, hyperextension, and lateral flexion. No midline tenderness or stepoffs. No tenderness to palpation of the spinous processes of the T-spine or L-spine. Mild tenderness to palpation of the paraspinous muscles of the LEFT L-spine. Positive straight leg raise on left at 40'.  Tenderness to palpation to left SI and piriformis muscle. Lymphadenopathy:    Pt has no cervical adenopathy.  Neurological: Pt is alert. Pt has normal reflexes.  Reflex Scores:      Bicep reflexes are 2+ on the right side and 2+ on the left side.      Brachioradialis reflexes are 2+ on the right side and 2+ on the left side.      Patellar reflexes are 2+ on the right side and 2+ on the left side.      Achilles reflexes are 2+ on the right side and 2+ on the left side. Speech is clear and goal oriented, follows commands Normal 5/5 strength in upper and lower extremities bilaterally including dorsiflexion and plantar flexion, strong and equal grip strength Sensation normal to light and sharp touch Moves extremities without ataxia, coordination intact Normal gait Normal balance No Clonus Skin: Skin is warm and dry. No rash noted or lesions noted. Pt is not diaphoretic. No erythema, ecchymosis,edema or warmth.   Psychiatric: Pt has a normal mood and affect. Behavior is normal.  Nursing note and vitals reviewed. ED Treatments / Results  Labs (all labs ordered are listed, but only abnormal results are displayed) Labs Reviewed  URINALYSIS, ROUTINE W REFLEX MICROSCOPIC - Abnormal; Notable for the following components:      Result Value   APPearance CLOUDY (*)    Specific Gravity, Urine >1.030 (*)    Hgb urine dipstick SMALL (*)    All other components within normal limits  URINALYSIS, MICROSCOPIC (REFLEX) - Abnormal; Notable for the following components:   Bacteria, UA RARE (*)    All other components within normal limits  PREGNANCY, URINE    EKG None  Radiology Dg Lumbar Spine Complete  Result Date: 01/05/2019 CLINICAL DATA:  Acute onset low back pain 3 days ago radiating down  LEFT leg EXAM: LUMBAR SPINE - COMPLETE 4+ VIEW COMPARISON:  CT abdomen and pelvis 04/28/2016 FINDINGS: 5 non-rib-bearing lumbar vertebra. Bones appear demineralized. Facet degenerative changes at L4-L5 and L5-S1. Disc space narrowing L4-L5 and L5-S1 with endplate spur formation L5-S1. Vertebral body heights maintained without fracture or subluxation. No bone destruction or spondylolysis. SI joints preserved. IMPRESSION: Degenerative disc and facet disease changes of L4-L5 and L5-S1. No acute abnormalities. Electronically Signed   By: Ulyses SouthwardMark  Boles M.D.   On: 01/05/2019 17:54    Procedures Procedures (including critical care time)  Medications Ordered in ED Medications  acetaminophen (TYLENOL) tablet 1,000 mg (1,000 mg Oral Given 01/05/19 1642)     Initial Impression / Assessment and Plan / ED Course  I have reviewed the triage vital signs and the nursing notes.  Pertinent labs & imaging results that were available during my care of the patient were reviewed by me and considered in my medical decision making (see chart for details).  58 year old female appears otherwise well presents for evaluation of back pain.   Afebrile, nonseptic, non-ill-appearing.  Patient ambulatory in ED without difficulty musculoskeletal exam with tenderness palpation over left piriformis and SI.  Positive straight leg raise on left at 45 degrees.  No recent injury or trauma.  No red flags for back pain.  Ambulatory however with pain.  No evidence of overlying skin changes, specifically no evidence of cellulitis, zoster or abscess.  Low suspicion for cauda equina, discitis, osteomyelitis, transverse myelitis, psoas abscess, septic joint, DVT.  X-ray with degenerative disc changes.  No neurological deficits and normal neuro exam. No fever, night sweats, weight loss, h/o cancer, IVDU.  RICE protocol and pain medicine indicated and discussed with patient. Urinalysis negative for infection. Pregnancy negative.     The patient has been appropriately medically screened and/or stabilized in the ED. I have low suspicion for any other emergent medical condition which would require further screening, evaluation or treatment in the ED or require inpatient management.  Patient is hemodynamically stable and in no acute distress.  Patient able to ambulate in department prior to ED.  Evaluation does not show acute pathology that would require ongoing or additional emergent interventions while in the emergency department or further inpatient treatment.  I have discussed the diagnosis with the patient and answered all questions.  Pain is been managed while in the emergency department and patient has no further complaints prior to discharge.  Patient is comfortable with plan discussed in room and is stable for discharge at this time.  I have discussed strict return precautions for returning to the emergency department.  Patient was encouraged to follow-up with PCP/specialist refer to at discharge.  1845: Addendum Prescriptions sent to the wrong pharmacy.  Previous pharmacy was called and narcotic prescription was canceled.  Resent to correct pharmacy. Final  Clinical Impressions(s) / ED Diagnoses   Final diagnoses:  Acute left-sided low back pain with left-sided sciatica    ED Discharge Orders         Ordered    lidocaine (LIDODERM) 5 %  Every 24 hours     01/05/19 1751    predniSONE (DELTASONE) 20 MG tablet  Daily     01/05/19 1751    HYDROcodone-acetaminophen (NORCO/VICODIN) 5-325 MG tablet  Every 6 hours PRN     01/05/19 1834           Rajean Desantiago A, PA-C 01/05/19 1835    Kivon Aprea A, PA-C 01/05/19 1846    Chaney MallingYao, David  Hsienta, MD 01/05/19 [id number]

## 2019-01-09 ENCOUNTER — Encounter (HOSPITAL_BASED_OUTPATIENT_CLINIC_OR_DEPARTMENT_OTHER): Payer: Self-pay | Admitting: *Deleted

## 2019-01-09 ENCOUNTER — Other Ambulatory Visit: Payer: Self-pay

## 2019-01-09 ENCOUNTER — Emergency Department (HOSPITAL_BASED_OUTPATIENT_CLINIC_OR_DEPARTMENT_OTHER)
Admission: EM | Admit: 2019-01-09 | Discharge: 2019-01-09 | Disposition: A | Discharge status: Home / Self Care | Payer: BC Managed Care – PPO | Attending: Emergency Medicine | Admitting: Emergency Medicine

## 2019-01-09 DIAGNOSIS — Z79899 Other long term (current) drug therapy: Secondary | ICD-10-CM | POA: Diagnosis not present

## 2019-01-09 DIAGNOSIS — I10 Essential (primary) hypertension: Secondary | ICD-10-CM | POA: Diagnosis not present

## 2019-01-09 DIAGNOSIS — M5432 Sciatica, left side: Secondary | ICD-10-CM | POA: Diagnosis not present

## 2019-01-09 DIAGNOSIS — M545 Low back pain: Secondary | ICD-10-CM | POA: Diagnosis present

## 2019-01-09 MED ORDER — METHOCARBAMOL 500 MG PO TABS: 500.0000 mg | ORAL_TABLET | Freq: Two times a day (BID) | ORAL | 0 refills | Status: DC

## 2019-01-09 MED ORDER — ACETAMINOPHEN 500 MG PO TABS
ORAL_TABLET | ORAL | Status: AC
  Filled 2019-01-09: qty 2

## 2019-01-09 MED ORDER — ACETAMINOPHEN 500 MG PO TABS
1000.0000 mg | ORAL_TABLET | Freq: Once | ORAL | Status: AC
  Administered 2019-01-09: 1000 mg via ORAL

## 2019-01-09 NOTE — Discharge Instructions (Signed)
You can take Tylenol or Ibuprofen as directed for pain. You can alternate Tylenol and Ibuprofen every 4 hours. If you take Tylenol at 1pm, then you can take Ibuprofen at 5pm. Then you can take Tylenol again at 9pm.   Take Robaxin as prescribed. This medication will make you drowsy so do not drive or drink alcohol when taking it.  Use lidoderm patches as directed.   Continue taking prednisone.   Return to the Emergency Department immediately for any worsening back pain, neck pain, difficulty walking, numbness/weaknss of your arms or legs, urinary or bowel accidents, fever or any other worsening or concerning symptoms.

## 2019-01-09 NOTE — ED Provider Notes (Signed)
[facility] EMERGENCY DEPARTMENT Provider Note   CSN: [id number] Arrival date & time: 01/09/19  1922    History   Chief Complaint Chief Complaint  Patient presents with   Leg Pain    HPI Christina [name] is a 58 y.o. female with PMH/o GERD, HTN who presents for evaluation of left lower back pain that radiates into the posterior aspect of the LLE. She states that this has been ongoing for the last 6-7 days. She was seen here in the ED on 01/05/19 for evaluation of same symptoms.  She was diagnosis with sciatica at that time.  She was discharged home with Lidoderm patch, hydrocodone and prednisone.  Patient reports she is only taken 1 of the hydrocodone.  She states she had taken the prednisone.  She has been using the Lidoderm.  She comes in today because she states that she continues to have pain.  She does report that at home, a friend gave her Tylenol arthritis which seemed to help her pain but states that it continues to occur and she is concerned.  She denies any new trauma, injury, fall.  She states that the pain is in the left lower back and radiates down the posterior aspect of her left leg.  She describes it as a "sharp, shooting pain."  She still been able to ambulate but does report worsening pain with ambulation, movement/bending of the back, movement of the left lower extremity. Denies fevers, weight loss, numbness/weakness of upper and lower extremities, bowel/bladder incontinence, saddle anesthesia, history of back surgery, history of IVDA.      The history is provided by the patient.    Past Medical History:  Diagnosis Date   GERD (gastroesophageal reflux disease)    Hypertension     Patient Active Problem List   Diagnosis Date Noted   Plantar fasciitis, left 01/08/2015   AD (atopic dermatitis) 07/30/2014   Left ankle sprain 12/30/2013   Right foot injury 07/10/2013   Pancreatitis, acute 11/02/2012   Chest pain 11/01/2012   Hypertension,  accelerated 11/01/2012   HYPERLIPIDEMIA 09/29/2008   HYPERTENSION 09/29/2008    Past Surgical History:  Procedure Laterality Date   CESAREAN SECTION     CHOLECYSTECTOMY       OB History   No obstetric history on file.      Home Medications    Prior to Admission medications   Medication Sig Start Date End Date Taking? Authorizing Provider  famotidine (PEPCID) 20 MG tablet Take 1 tablet (20 mg total) by mouth 2 (two) times daily. 09/17/17   Khatri, Hina, PA-C  HYDROcodone-acetaminophen (NORCO/VICODIN) 5-325 MG tablet Take 1 tablet by mouth every 6 (six) hours as needed for severe pain. 01/05/19   Henderly, Britni A, PA-C  lidocaine (LIDODERM) 5 % Place 1 patch onto the skin daily. Remove & Discard patch within 12 hours or as directed by MD 01/05/19   Henderly, Britni A, PA-C  lisinopril (PRINIVIL,ZESTRIL) 20 MG tablet  04/06/18   [provider]  methocarbamol (ROBAXIN) 500 MG tablet Take 1 tablet (500 mg total) by mouth 2 (two) times daily. 01/09/19   Maxwell CaulLayden, Leotta Weingarten A, PA-C  omeprazole (PRILOSEC) 20 MG capsule Take 2 capsules (40 mg total) by mouth 2 (two) times daily before a meal. 05/06/15   Joy, Shawn C, PA-C  pantoprazole (PROTONIX) 40 MG tablet  02/12/18   [provider]  predniSONE (DELTASONE) 20 MG tablet Take 2 tablets (40 mg total) by mouth daily for 5 days. 01/05/19  01/10/19  Henderly, Britni A, PA-C  sucralfate (CARAFATE) 1 GM/10ML suspension Take 10 mLs (1 g total) by mouth 4 (four) times daily -  with meals and at bedtime. 09/17/17   Dietrich PatesKhatri, Hina, PA-C    Family History Family History  Problem Relation Age of Onset   CAD Father    Hypertension Father    Diabetes Mother    Hypertension Mother    Diabetes Sister    Hypertension Sister    Diabetes Brother    Hypertension Brother    Hyperlipidemia Neg Hx     Social History Social History   Tobacco Use   Smoking status: Never Smoker   Smokeless tobacco: Never Used  Substance Use  Topics   Alcohol use: Not Currently    Alcohol/week: 0.0 standard drinks   Drug use: No     Allergies   Buprenorphine hcl, Iodinated diagnostic agents, and Morphine and related   Review of Systems Review of Systems  Constitutional: Negative for fever.  Respiratory: Negative for cough and shortness of breath.   Cardiovascular: Negative for chest pain.  Gastrointestinal: Negative for abdominal pain, nausea and vomiting.  Genitourinary: Negative for dysuria and hematuria.  Musculoskeletal: Positive for back pain.       LLE pain  Neurological: Negative for weakness and numbness.  All other systems reviewed and are negative.    Physical Exam Updated Vital Signs BP (!) 162/76 (BP Location: Left Arm)    Pulse 76    Temp 99.3 F (37.4 C) (Oral)    Resp 16    Ht 5\' 5"  (1.651 m)    Wt 96.2 kg    SpO2 99%    BMI 35.29 kg/m   Physical Exam Vitals signs and nursing note reviewed.  Constitutional:      Appearance: She is well-developed.  HENT:     Head: Normocephalic and atraumatic.  Eyes:     General: No scleral icterus.       Right eye: No discharge.        Left eye: No discharge.     Conjunctiva/sclera: Conjunctivae normal.  Neck:     Musculoskeletal: Normal range of motion.     Comments: No midline bony tenderness.  Full range of motion without any difficulty. Pulmonary:     Effort: Pulmonary effort is normal.  Musculoskeletal:     Comments: No midline T or L-spine tenderness.  Diffuse tenderness palpation noted of posterior gluteal that extends posteriorly.  Skin:    General: Skin is warm and dry.     Capillary Refill: Capillary refill takes less than 2 seconds.     Comments: No rash. Good distal cap refill. LLE is not dusky in appearance or cool to touch.  Neurological:     Mental Status: She is alert.     Comments: Follows commands, Moves all extremities  5/5 strength to BUE and BLE  Sensation intact throughout all major nerve distributions Positive SLR Normal  gait  Psychiatric:        Speech: Speech normal.        Behavior: Behavior normal.      ED Treatments / Results  Labs (all labs ordered are listed, but only abnormal results are displayed) Labs Reviewed - No data to display  EKG None  Radiology No results found.  Procedures Procedures (including critical care time)  Medications Ordered in ED Medications  acetaminophen (TYLENOL) 500 MG tablet (has no administration in time range)  acetaminophen (TYLENOL) tablet 1,000 mg (1,000 mg  Oral Given 01/09/19 2045)     Initial Impression / Assessment and Plan / ED Course  I have reviewed the triage vital signs and the nursing notes.  Pertinent labs & imaging results that were available during my care of the patient were reviewed by me and considered in my medical decision making (see chart for details).        58 year old female who presents for evaluation of continued lower back pain that extends to the left lower extremity.  Seen here on 01/05/19 for evaluation of same symptoms diagnosed with sciatica.  Discharged home with hydrocodone, prednisone, Lidoderm.  Is in today because she continues to have pain.  No new trauma, injury.  No neuro deficits, red flags.  She states that she thought that this would be fixed by now.  She does not report she only took 1 dose of the hydrocodone.  Has been taking Lidoderm and prednisone.  On exam, she has tenderness palpation in left gluteal and positive straight leg raise.  Concern for sciatica versus musculoskeletal pain.  Patient with no neuro deficits, red flags.  She is able to ambulate.  History/physical exam not concerning for cauda equina, spinal abscess.  Additionally, do not expect ischemic leg, DVT of left lower extremity.  I discussed with patient that sciatica can often take several days/weeks to get better.  Encouraged continued use of supportive care measures as well as medication.  Patient does states she feels like she got more relief  with Tylenol.  Encouraged her to take NSAIDs as directed.  Additionally, instructed patient to stop hydrocodone she does not feel like it is working or if it is too strong.  We will give her Robaxin.  Strict patient not to take Robaxin hydrocodone at the same time.  Patient with no new trauma, injury.  Indication for new x-rays at this time. At this time, patient exhibits no emergent life-threatening condition that require further evaluation in ED or admission. Patient had ample opportunity for questions and discussion. All patient's questions were answered with full understanding. Strict return precautions discussed. Patient expresses understanding and agreement to plan.   Portions of this note were generated with Lobby[employer]. Dictation errors may occur despite best attempts at proofreading.   Final Clinical Impressions(s) / ED Diagnoses   Final diagnoses:  Sciatica of left side    ED Discharge Orders         Ordered    methocarbamol (ROBAXIN) 500 MG tablet  2 times daily     01/09/19 2045           Desma Mcgregor 01/09/19 2234    Davonna Belling, MD 01/09/19 2328

## 2019-01-09 NOTE — ED Triage Notes (Signed)
Recheck for left sided sciatica. Reports no relief with pain medication.

## 2019-01-22 ENCOUNTER — Encounter (HOSPITAL_BASED_OUTPATIENT_CLINIC_OR_DEPARTMENT_OTHER): Payer: Self-pay | Admitting: *Deleted

## 2019-01-22 ENCOUNTER — Ambulatory Visit: Payer: BC Managed Care – PPO | Admitting: Family Medicine

## 2019-01-22 ENCOUNTER — Other Ambulatory Visit: Payer: Self-pay

## 2019-01-22 ENCOUNTER — Emergency Department (HOSPITAL_BASED_OUTPATIENT_CLINIC_OR_DEPARTMENT_OTHER)
Admission: EM | Admit: 2019-01-22 | Discharge: 2019-01-22 | Disposition: A | Discharge status: Home / Self Care | Payer: BC Managed Care – PPO | Attending: Emergency Medicine | Admitting: Emergency Medicine

## 2019-01-22 ENCOUNTER — Emergency Department (HOSPITAL_BASED_OUTPATIENT_CLINIC_OR_DEPARTMENT_OTHER): Payer: BC Managed Care – PPO

## 2019-01-22 DIAGNOSIS — Z79899 Other long term (current) drug therapy: Secondary | ICD-10-CM | POA: Diagnosis not present

## 2019-01-22 DIAGNOSIS — K759 Inflammatory liver disease, unspecified: Secondary | ICD-10-CM | POA: Diagnosis not present

## 2019-01-22 DIAGNOSIS — I10 Essential (primary) hypertension: Secondary | ICD-10-CM | POA: Insufficient documentation

## 2019-01-22 DIAGNOSIS — R42 Dizziness and giddiness: Secondary | ICD-10-CM | POA: Diagnosis present

## 2019-01-22 LAB — CBC WITH DIFFERENTIAL/PLATELET
Abs Immature Granulocytes: 0.04 10*3/uL (ref 0.00–0.07)
Basophils Absolute: 0 10*3/uL (ref 0.0–0.1)
Basophils Relative: 0 %
Eosinophils Absolute: 0 10*3/uL (ref 0.0–0.5)
Eosinophils Relative: 1 %
HCT: 42.7 % (ref 36.0–46.0)
Hemoglobin: 12.8 g/dL (ref 12.0–15.0)
Immature Granulocytes: 1 %
Lymphocytes Relative: 18 %
Lymphs Abs: 1.3 10*3/uL (ref 0.7–4.0)
MCH: 26 pg (ref 26.0–34.0)
MCHC: 30 g/dL (ref 30.0–36.0)
MCV: 86.6 fL (ref 80.0–100.0)
Monocytes Absolute: 0.5 10*3/uL (ref 0.1–1.0)
Monocytes Relative: 6 %
Neutro Abs: 5.4 10*3/uL (ref 1.7–7.7)
Neutrophils Relative %: 74 %
Platelets: 247 10*3/uL (ref 150–400)
RBC: 4.93 MIL/uL (ref 3.87–5.11)
RDW: 13.8 % (ref 11.5–15.5)
WBC: 7.3 10*3/uL (ref 4.0–10.5)
nRBC: 0 % (ref 0.0–0.2)

## 2019-01-22 LAB — URINALYSIS, ROUTINE W REFLEX MICROSCOPIC
Bilirubin Urine: NEGATIVE
Glucose, UA: NEGATIVE mg/dL
Ketones, ur: NEGATIVE mg/dL
Leukocytes,Ua: NEGATIVE
Nitrite: NEGATIVE
Protein, ur: 100 mg/dL — AB
Specific Gravity, Urine: 1.025 (ref 1.005–1.030)
pH: 6.5 (ref 5.0–8.0)

## 2019-01-22 LAB — COMPREHENSIVE METABOLIC PANEL
ALT: 1734 U/L — ABNORMAL HIGH (ref 0–44)
AST: 2662 U/L — ABNORMAL HIGH (ref 15–41)
Albumin: 4 g/dL (ref 3.5–5.0)
Alkaline Phosphatase: 187 U/L — ABNORMAL HIGH (ref 38–126)
Anion gap: 12 (ref 5–15)
BUN: 19 mg/dL (ref 6–20)
CO2: 24 mmol/L (ref 22–32)
Calcium: 9.3 mg/dL (ref 8.9–10.3)
Chloride: 102 mmol/L (ref 98–111)
Creatinine, Ser: 0.85 mg/dL (ref 0.44–1.00)
GFR calc Af Amer: >60 mL/min (ref >60)
GFR calc non Af Amer: >60 mL/min (ref >60)
Glucose, Bld: 115 mg/dL — ABNORMAL HIGH (ref 70–99)
Potassium: 4 mmol/L (ref 3.5–5.1)
Sodium: 138 mmol/L (ref 135–145)
Total Bilirubin: 1.9 mg/dL — ABNORMAL HIGH (ref 0.3–1.2)
Total Protein: 7.1 g/dL (ref 6.5–8.1)

## 2019-01-22 LAB — URINALYSIS, MICROSCOPIC (REFLEX): Squamous Epithelial / HPF: >50 (ref 0–5)

## 2019-01-22 NOTE — ED Triage Notes (Addendum)
Dizziness, fatigue. States she has pain in her back with radiation down her left leg.

## 2019-01-22 NOTE — ED Notes (Signed)
IV access attempted x1 unsuccessful.  Able to send lab specimens.  Pt refuses additional venipuncture attempts unless necessary for medication/treatment.  Transported to CT

## 2019-01-22 NOTE — ED Provider Notes (Signed)
[facility] EMERGENCY DEPARTMENT Provider Note   CSN: [id number] Arrival date & time: 01/22/19  1355     History   Chief Complaint Chief Complaint  Patient presents with  . Dizziness  . Leg Pain    HPI Urban Gibsonieves Westerhold is a 58 y.o. female.     Patient being followed by primary care doctor for left-sided leg pain and back pain which is probably left-sided sciatica.  But the main reason why she came in today is that she had some dizziness and fatigue and felt as if she was going to pass out.  Not due to pain in her back or leg.  Her family doctor has referred her to a sports medicine for evaluation of the back pain.  And she has been taking Robaxin for that.  Patient denies any fevers or upper respiratory infection symptoms.  Denies any nausea or vomiting.     Past Medical History:  Diagnosis Date  . GERD (gastroesophageal reflux disease)   . Hypertension     Patient Active Problem List   Diagnosis Date Noted  . Plantar fasciitis, left 01/08/2015  . AD (atopic dermatitis) 07/30/2014  . Left ankle sprain 12/30/2013  . Right foot injury 07/10/2013  . Pancreatitis, acute 11/02/2012  . Chest pain 11/01/2012  . Hypertension, accelerated 11/01/2012  . HYPERLIPIDEMIA 09/29/2008  . HYPERTENSION 09/29/2008    Past Surgical History:  Procedure Laterality Date  . CESAREAN SECTION    . CHOLECYSTECTOMY       OB History   No obstetric history on file.      Home Medications    Prior to Admission medications   Medication Sig Start Date End Date Taking? Authorizing Provider  famotidine (PEPCID) 20 MG tablet Take 1 tablet (20 mg total) by mouth 2 (two) times daily. 09/17/17   Khatri, Hina, PA-C  HYDROcodone-acetaminophen (NORCO/VICODIN) 5-325 MG tablet Take 1 tablet by mouth every 6 (six) hours as needed for severe pain. 01/05/19   Henderly, Britni A, PA-C  lidocaine (LIDODERM) 5 % Place 1 patch onto the skin daily. Remove & Discard patch within 12 hours or as  directed by MD 01/05/19   Henderly, Britni A, PA-C  lisinopril (PRINIVIL,ZESTRIL) 20 MG tablet  04/06/18   [provider]  methocarbamol (ROBAXIN) 500 MG tablet Take 1 tablet (500 mg total) by mouth 2 (two) times daily. 01/09/19   Maxwell CaulLayden, Lindsey A, PA-C  omeprazole (PRILOSEC) 20 MG capsule Take 2 capsules (40 mg total) by mouth 2 (two) times daily before a meal. 05/06/15   Joy, Shawn C, PA-C  pantoprazole (PROTONIX) 40 MG tablet  02/12/18   [provider]  sucralfate (CARAFATE) 1 GM/10ML suspension Take 10 mLs (1 g total) by mouth 4 (four) times daily -  with meals and at bedtime. 09/17/17   Dietrich PatesKhatri, Hina, PA-C    Family History Family History  Problem Relation Age of Onset  . CAD Father   . Hypertension Father   . Diabetes Mother   . Hypertension Mother   . Diabetes Sister   . Hypertension Sister   . Diabetes Brother   . Hypertension Brother   . Hyperlipidemia Neg Hx     Social History Social History   Tobacco Use  . Smoking status: Never Smoker  . Smokeless tobacco: Never Used  Substance Use Topics  . Alcohol use: Not Currently    Alcohol/week: 0.0 standard drinks  . Drug use: No     Allergies   Buprenorphine hcl, Iodinated  diagnostic agents, and Morphine and related   Review of Systems Review of Systems  Constitutional: Positive for fatigue. Negative for chills and fever.  HENT: Negative for congestion, rhinorrhea and sore throat.   Eyes: Negative for visual disturbance.  Respiratory: Negative for cough and shortness of breath.   Cardiovascular: Negative for chest pain and leg swelling.  Gastrointestinal: Negative for abdominal pain, diarrhea, nausea and vomiting.  Genitourinary: Negative for dysuria.  Musculoskeletal: Negative for back pain and neck pain.  Skin: Negative for rash.  Neurological: Positive for dizziness and light-headedness. Negative for syncope, weakness and headaches.  Hematological: Does not bruise/bleed easily.   Psychiatric/Behavioral: Negative for confusion.     Physical Exam Updated Vital Signs BP 132/79 (BP Location: Right Arm)   Pulse 81   Temp 98.9 F (37.2 C) (Oral)   Resp (!) 23   Ht 1.651 m (5\' 5" )   Wt 96.2 kg   SpO2 100%   BMI 35.29 kg/m   Physical Exam Vitals signs and nursing note reviewed.  Constitutional:      General: She is not in acute distress.    Appearance: Normal appearance. She is well-developed.  HENT:     Head: Normocephalic and atraumatic.  Eyes:     Extraocular Movements: Extraocular movements intact.     Conjunctiva/sclera: Conjunctivae normal.     Pupils: Pupils are equal, round, and reactive to light.  Neck:     Musculoskeletal: Normal range of motion and neck supple.  Cardiovascular:     Rate and Rhythm: Normal rate and regular rhythm.     Heart sounds: No murmur.  Pulmonary:     Effort: Pulmonary effort is normal. No respiratory distress.     Breath sounds: Normal breath sounds.  Abdominal:     General: Bowel sounds are normal.     Palpations: Abdomen is soft.     Tenderness: There is no abdominal tenderness.  Musculoskeletal: Normal range of motion.  Skin:    General: Skin is warm and dry.     Capillary Refill: Capillary refill takes less than 2 seconds.  Neurological:     General: No focal deficit present.     Mental Status: She is alert and oriented to person, place, and time.      ED Treatments / Results  Labs (all labs ordered are listed, but only abnormal results are displayed) Labs Reviewed  COMPREHENSIVE METABOLIC PANEL - Abnormal; Notable for the following components:      Result Value   Glucose, Bld 115 (*)    AST 2,662 (*)    ALT 1,734 (*)    Alkaline Phosphatase 187 (*)    Total Bilirubin 1.9 (*)    All other components within normal limits  URINALYSIS, ROUTINE W REFLEX MICROSCOPIC - Abnormal; Notable for the following components:   APPearance CLOUDY (*)    Hgb urine dipstick TRACE (*)    Protein, ur 100 (*)    All  other components within normal limits  URINALYSIS, MICROSCOPIC (REFLEX) - Abnormal; Notable for the following components:   Bacteria, UA MANY (*)    All other components within normal limits  CBC WITH DIFFERENTIAL/PLATELET  HEPATITIS PANEL, ACUTE    EKG EKG Interpretation  Date/Time:  Tuesday January 22 2019 15:50:19 EDT Ventricular Rate:  81 PR Interval:    QRS Duration: 135 QT Interval:  373 QTC Calculation: 433 R Axis:   79 Text Interpretation:  Sinus rhythm Right bundle branch block Baseline wander in lead(s) V2 No significant  change since last tracing Confirmed by Vanetta MuldersZackowski, Jaimya Feliciano [phone]([id number]) on 01/22/2019 4:12:23 PM   Radiology Ct Abdomen Pelvis Wo Contrast  Result Date: 01/22/2019 CLINICAL DATA:  58 year old female with history of jaundice. EXAM: CT ABDOMEN AND PELVIS WITHOUT CONTRAST TECHNIQUE: Multidetector CT imaging of the abdomen and pelvis was performed following the standard protocol without IV contrast. COMPARISON:  CT the abdomen and pelvis 04/28/2016. FINDINGS: Lower chest: Unremarkable. Hepatobiliary: No definite suspicious cystic or solid hepatic lesions are confidently identified on today's noncontrast CT examination. Status post cholecystectomy. Pancreas: No definite pancreatic mass or peripancreatic fluid collections or inflammatory changes noted on today's examination. Spleen: Unremarkable. Adrenals/Urinary Tract: Unenhanced appearance of the kidneys and bilateral adrenal glands is normal. No urinary tract calculi. No hydroureteronephrosis. Urinary bladder is normal in appearance. Stomach/Bowel: Unenhanced appearance of the stomach is normal. No pathologic dilatation of small bowel or colon. Normal appendix. Vascular/Lymphatic: Aortic atherosclerosis. Reproductive: No lymphadenopathy noted in the abdomen or pelvis. Other: No significant volume of ascites.  No pneumoperitoneum. Musculoskeletal: Faintly sclerotic 1.8 x 1.6 x 1.9 cm lesion in L3 vertebral body, stable dating  back to 2013, compatible with a benign lesion. There are no other aggressive appearing lytic or blastic lesions noted in the visualized portions of the skeleton. IMPRESSION: 1. No acute findings are noted on today's noncontrast CT examination to account for the patient's history of jaundice. 2. Status post cholecystectomy. 3. Aortic atherosclerosis. 4. Additional incidental findings, as above. Electronically Signed   By: Trudie Reedaniel  Entrikin M.D.   On: 01/22/2019 18:08   Dg Chest 2 View  Result Date: 01/22/2019 CLINICAL DATA:  Near syncope, sweats and fatigue back pain with radiation to left leg EXAM: CHEST - 2 VIEW COMPARISON:  Radiograph 04/09/2014 FINDINGS: No consolidation, features of edema, pneumothorax, or effusion. Pulmonary vascularity is normally distributed. The cardiomediastinal contours are unremarkable. No acute osseous or soft tissue abnormality. Degenerative changes are present in the imaged spine and shoulders. Cholecystectomy clips in the right upper quadrant. EKG leads project over the left abdomen. IMPRESSION: No acute cardiopulmonary abnormality. Electronically Signed   By: Kreg ShropshirePrice  DeHay M.D.   On: 01/22/2019 16:33    Procedures Procedures (including critical care time)  Medications Ordered in ED Medications - No data to display   Initial Impression / Assessment and Plan / ED Course  I have reviewed the triage vital signs and the nursing notes.  Pertinent labs & imaging results that were available during my care of the patient were reviewed by me and considered in my medical decision making (see chart for details).        Work-up here was for the near syncopal episode.  Patient had markedly abnormal liver function test with consistent with hepatitis.  Including a bilirubin of 1.9.  Patient without any significant abdominal complaints.  For the syncope patient had EKG chest x-ray without any acute findings.  But for the abnormal liver function test patient had hepatitis panel  sent.  And had CT scan of the abdomen.  CT scan of the abdomen without any abnormalities.  So this may very well represent a viral hepatitis.  Only new medication for patient is Robaxin.  That should not be causing any hepatitis.  Patient given precautions and referred back to primary care doctor.  Hepatitis panel is pending.  Final Clinical Impressions(s) / ED Diagnoses   Final diagnoses:  Hepatitis    ED Discharge Orders    None       Vanetta MuldersZackowski, Erendira Crabtree, MD 01/22/19 [phone]

## 2019-01-22 NOTE — Discharge Instructions (Addendum)
Work-up here today very concerning for hepatitis.  But CT scan of the abdomen without any acute abnormalities inside the abdomen.  Marked elevation of liver function test.  And bilirubin.  Hepatitis panel sent but is pending.  Will take a few days to come back.  Make an appointment to follow-up with your doctor call tomorrow for follow-up this week.  Return for any new or worse symptoms.  Continue your medication that you are taking for your back pain.

## 2019-01-22 NOTE — ED Notes (Signed)
Patient transported to CT 

## 2019-01-22 NOTE — Progress Notes (Deleted)
Christina [name] - 58 y.o. female MRN [id number]  Date of birth: 06/30/1960  SUBJECTIVE:  Including CC & ROS.  No chief complaint on file.   Christina [name] is a 58 y.o. female that is  ***.  Independent review of the lumbar spine from 7/25 shows significant degenerative changes between L5 and S1 of the disc.   Review of Systems  HISTORY: Past Medical, Surgical, Social, and Family History Reviewed & Updated per EMR.   Pertinent Historical Findings include:  Past Medical History:  Diagnosis Date  . GERD (gastroesophageal reflux disease)   . Hypertension     Past Surgical History:  Procedure Laterality Date  . CESAREAN SECTION    . CHOLECYSTECTOMY      Allergies  Allergen Reactions  . Buprenorphine Hcl Itching  . Iodinated Diagnostic Agents Rash    Other reaction(s): Rash   . Morphine And Related Itching    Other reaction(s): Itching     Family History  Problem Relation Age of Onset  . CAD Father   . Hypertension Father   . Diabetes Mother   . Hypertension Mother   . Diabetes Sister   . Hypertension Sister   . Diabetes Brother   . Hypertension Brother   . Hyperlipidemia Neg Hx      Social History   Socioeconomic History  . Marital status: Married    Spouse name: Not on file  . Number of children: Not on file  . Years of education: Not on file  . Highest education level: Not on file  Occupational History  . Not on file  Social Needs  . Financial resource strain: Not on file  . Food insecurity    Worry: Not on file    Inability: Not on file  . Transportation needs    Medical: Not on file    Non-medical: Not on file  Tobacco Use  . Smoking status: Never Smoker  . Smokeless tobacco: Never Used  Substance and Sexual Activity  . Alcohol use: Not Currently    Alcohol/week: 0.0 standard drinks  . Drug use: No  . Sexual activity: Not on file  Lifestyle  . Physical activity    Days per week: Not on file    Minutes per session: Not on file  .  Stress: Not on file  Relationships  . Social Musicianconnections    Talks on phone: Not on file    Gets together: Not on file    Attends religious service: Not on file    Active member of club or organization: Not on file    Attends meetings of clubs or organizations: Not on file    Relationship status: Not on file  . Intimate partner violence    Fear of current or ex partner: Not on file    Emotionally abused: Not on file    Physically abused: Not on file    Forced sexual activity: Not on file  Other Topics Concern  . Not on file  Social History Narrative  . Not on file     PHYSICAL EXAM:  VS: There were no vitals taken for this visit. Physical Exam Gen: NAD, alert, cooperative with exam, well-appearing ENT: normal lips, normal nasal mucosa,  Eye: normal EOM, normal conjunctiva and lids CV:  no edema, +2 pedal pulses   Resp: no accessory muscle use, non-labored,  GI: no masses or tenderness, no hernia  Skin: no rashes, no areas of induration  Neuro: normal tone, normal sensation to touch Psych:  normal insight, alert and oriented MSK:  ***      ASSESSMENT & PLAN:   No problem-specific Assessment & Plan notes found for this encounter.

## 2019-01-22 NOTE — ED Notes (Signed)
ED Provider at bedside. 

## 2019-01-22 NOTE — ED Notes (Signed)
Pt requesting po fluids,

## 2019-01-23 ENCOUNTER — Ambulatory Visit (INDEPENDENT_AMBULATORY_CARE_PROVIDER_SITE_OTHER): Payer: BC Managed Care – PPO | Admitting: Family Medicine

## 2019-01-23 ENCOUNTER — Encounter: Payer: Self-pay | Admitting: Family Medicine

## 2019-01-23 DIAGNOSIS — M5416 Radiculopathy, lumbar region: Secondary | ICD-10-CM | POA: Diagnosis not present

## 2019-01-23 LAB — HEPATITIS PANEL, ACUTE
HCV Ab: <0.1 s/co ratio (ref 0.0–0.9)
Hep A IgM: NEGATIVE
Hep B C IgM: NEGATIVE
Hepatitis B Surface Ag: NEGATIVE

## 2019-01-23 MED ORDER — GABAPENTIN 100 MG PO CAPS: 100.0000 mg | ORAL_CAPSULE | Freq: Three times a day (TID) | ORAL | 1 refills | Status: AC

## 2019-01-23 MED ORDER — GABAPENTIN 100 MG PO CAPS: 100.0000 mg | ORAL_CAPSULE | Freq: Three times a day (TID) | ORAL | 1 refills | Status: DC

## 2019-01-23 NOTE — Patient Instructions (Signed)
Nice to meet you Please stop the other medications. No tylenol.  Please try the exercises  Please try the gabapentin. Please start with one pill at night. You can increase to two pills daily and up to three times daily.  Please send me a message in MyChart with any questions or updates.  Please see me back in 4 weeks.   --Dr. Raeford Razor

## 2019-01-23 NOTE — Progress Notes (Signed)
Christina [name] - 58 y.o. female MRN [id number]  Date of birth: 1961/05/28  SUBJECTIVE:  Including CC & ROS.  Chief Complaint  Patient presents with  . Hip Pain    left-sided x 2 weeks    Christina [name] is a 58 y.o. female that is presenting with left lateral leg pain and low back pain.  The pain is been ongoing for a few weeks.  She is never had pain similar to this.  She received a steroid injection into the buttock with limited improvement.  She denies any specific inciting event.  The pain is worse when she is lying down.  She is also been prescribed tramadol, prednisone and hydrocodone.  The pain is severe in nature and pinching.  She denies urinary incontinence or saddle anesthesia.  Independent review of the AST and ALT show elevations into the thousands on 8/11..  Independent review of the CT abdomen pelvis from 8/11 shows significant degenerative changes between L5-S1.   Review of Systems  Constitutional: Negative for fever.  HENT: Negative for congestion.   Respiratory: Negative for cough.   Cardiovascular: Negative for chest pain.  Gastrointestinal: Negative for abdominal pain.  Musculoskeletal: Positive for back pain.  Skin: Negative for color change.  Neurological: Negative for weakness.  Hematological: Negative for adenopathy.    HISTORY: Past Medical, Surgical, Social, and Family History Reviewed & Updated per EMR.   Pertinent Historical Findings include:  Past Medical History:  Diagnosis Date  . GERD (gastroesophageal reflux disease)   . Hypertension     Past Surgical History:  Procedure Laterality Date  . CESAREAN SECTION    . CHOLECYSTECTOMY      Allergies  Allergen Reactions  . Buprenorphine Hcl Itching  . Iodinated Diagnostic Agents Rash    Other reaction(s): Rash   . Morphine And Related Itching    Other reaction(s): Itching     Family History  Problem Relation Age of Onset  . CAD Father   . Hypertension Father   . Diabetes Mother   .  Hypertension Mother   . Diabetes Sister   . Hypertension Sister   . Diabetes Brother   . Hypertension Brother   . Hyperlipidemia Neg Hx      Social History   Socioeconomic History  . Marital status: Married    Spouse name: Not on file  . Number of children: Not on file  . Years of education: Not on file  . Highest education level: Not on file  Occupational History  . Not on file  Social Needs  . Financial resource strain: Not on file  . Food insecurity    Worry: Not on file    Inability: Not on file  . Transportation needs    Medical: Not on file    Non-medical: Not on file  Tobacco Use  . Smoking status: Never Smoker  . Smokeless tobacco: Never Used  Substance and Sexual Activity  . Alcohol use: Not Currently    Alcohol/week: 0.0 standard drinks  . Drug use: No  . Sexual activity: Not on file  Lifestyle  . Physical activity    Days per week: Not on file    Minutes per session: Not on file  . Stress: Not on file  Relationships  . Social Herbalist on phone: Not on file    Gets together: Not on file    Attends religious service: Not on file    Active member of club or organization: Not on file  Attends meetings of clubs or organizations: Not on file    Relationship status: Not on file  . Intimate partner violence    Fear of current or ex partner: Not on file    Emotionally abused: Not on file    Physically abused: Not on file    Forced sexual activity: Not on file  Other Topics Concern  . Not on file  Social History Narrative  . Not on file     PHYSICAL EXAM:  VS: BP 138/66   Ht 5\' 5"  (1.651 m)   Wt 200 lb (90.7 kg)   BMI 33.28 kg/m  Physical Exam Gen: NAD, alert, cooperative with exam, well-appearing ENT: normal lips, normal nasal mucosa,  Eye: normal EOM, normal conjunctiva and lids CV:  no edema, +2 pedal pulses   Resp: no accessory muscle use, non-labored,  Skin: no rashes, no areas of induration  Neuro: normal tone, normal  sensation to touch Psych:  normal insight, alert and oriented MSK:  Back/left hip: No tenderness to palpation of the greater trochanter or SI joint. Normal internal and external rotation of the hip. Normal strength resistance with hip flexion, knee flexion extension, plantarflexion and dorsiflexion. Positive straight leg raise. Neurovascular intact     ASSESSMENT & PLAN:   Lumbar radiculopathy Pain seems consistent with radicular pattern down the leg.  Does have degenerative changes seen on recent CT scan which could suspect of a nerve impingement. -Advised to discontinue Tylenol or any medications containing Tylenol. -Initiate gabapentin and can titrate slowly. -Counseled on home exercise therapy and supportive care. -If no improvement will consider physical therapy

## 2019-01-23 NOTE — Assessment & Plan Note (Signed)
Pain seems consistent with radicular pattern down the leg.  Does have degenerative changes seen on recent CT scan which could suspect of a nerve impingement. -Advised to discontinue Tylenol or any medications containing Tylenol. -Initiate gabapentin and can titrate slowly. -Counseled on home exercise therapy and supportive care. -If no improvement will consider physical therapy

## 2019-02-25 ENCOUNTER — Ambulatory Visit: Payer: BC Managed Care – PPO | Admitting: Family Medicine

## 2019-02-25 NOTE — Progress Notes (Deleted)
Christina [name] - 58 y.o. female MRN [id number]  Date of birth: [birth year]  SUBJECTIVE:  Including CC & ROS.  No chief complaint on file.   Christina [name] is a 58 y.o. female that is  ***.  ***   Review of Systems  HISTORY: Past Medical, Surgical, Social, and Family History Reviewed & Updated per EMR.   Pertinent Historical Findings include:  Past Medical History:  Diagnosis Date  . GERD (gastroesophageal reflux disease)   . Hypertension     Past Surgical History:  Procedure Laterality Date  . CESAREAN SECTION    . CHOLECYSTECTOMY      Allergies  Allergen Reactions  . Buprenorphine Hcl Itching  . Iodinated Diagnostic Agents Rash    Other reaction(s): Rash   . Morphine And Related Itching    Other reaction(s): Itching     Family History  Problem Relation Age of Onset  . CAD Father   . Hypertension Father   . Diabetes Mother   . Hypertension Mother   . Diabetes Sister   . Hypertension Sister   . Diabetes Brother   . Hypertension Brother   . Hyperlipidemia Neg Hx      Social History   Socioeconomic History  . Marital status: Married    Spouse name: Not on file  . Number of children: Not on file  . Years of education: Not on file  . Highest education level: Not on file  Occupational History  . Not on file  Social Needs  . Financial resource strain: Not on file  . Food insecurity    Worry: Not on file    Inability: Not on file  . Transportation needs    Medical: Not on file    Non-medical: Not on file  Tobacco Use  . Smoking status: Never Smoker  . Smokeless tobacco: Never Used  Substance and Sexual Activity  . Alcohol use: Not Currently    Alcohol/week: 0.0 standard drinks  . Drug use: No  . Sexual activity: Not on file  Lifestyle  . Physical activity    Days per week: Not on file    Minutes per session: Not on file  . Stress: Not on file  Relationships  . Social Herbalist on phone: Not on file    Gets together: Not on file     Attends religious service: Not on file    Active member of club or organization: Not on file    Attends meetings of clubs or organizations: Not on file    Relationship status: Not on file  . Intimate partner violence    Fear of current or ex partner: Not on file    Emotionally abused: Not on file    Physically abused: Not on file    Forced sexual activity: Not on file  Other Topics Concern  . Not on file  Social History Narrative  . Not on file     PHYSICAL EXAM:  VS: There were no vitals taken for this visit. Physical Exam Gen: NAD, alert, cooperative with exam, well-appearing ENT: normal lips, normal nasal mucosa,  Eye: normal EOM, normal conjunctiva and lids CV:  no edema, +2 pedal pulses   Resp: no accessory muscle use, non-labored,  GI: no masses or tenderness, no hernia  Skin: no rashes, no areas of induration  Neuro: normal tone, normal sensation to touch Psych:  normal insight, alert and oriented MSK:  ***      ASSESSMENT & PLAN:  No problem-specific Assessment & Plan notes found for this encounter.

## 2019-04-29 ENCOUNTER — Ambulatory Visit (INDEPENDENT_AMBULATORY_CARE_PROVIDER_SITE_OTHER): Payer: BC Managed Care – PPO | Admitting: Family Medicine

## 2019-04-29 ENCOUNTER — Other Ambulatory Visit: Payer: Self-pay

## 2019-04-29 ENCOUNTER — Encounter: Payer: Self-pay | Admitting: Family Medicine

## 2019-04-29 VITALS — BP 156/84 | HR 75 | Ht 65.0 in | Wt 217.0 lb

## 2019-04-29 DIAGNOSIS — M79604 Pain in right leg: Secondary | ICD-10-CM | POA: Diagnosis not present

## 2019-04-29 DIAGNOSIS — M79605 Pain in left leg: Secondary | ICD-10-CM | POA: Diagnosis not present

## 2019-04-29 MED ORDER — METHOCARBAMOL 500 MG PO TABS: 500.0000 mg | ORAL_TABLET | Freq: Two times a day (BID) | ORAL | 0 refills | Status: DC | PRN

## 2019-04-29 MED ORDER — PREDNISONE 5 MG PO TABS: ORAL_TABLET | ORAL | 0 refills | Status: DC

## 2019-04-29 NOTE — Patient Instructions (Signed)
Good to see you Please avoid sitting for longer than 30 minutes at a time.  Please try the prednisone if you don't have improvement of your symptoms when you change the amount of time sitting.  Please use the robaxin as needed at night   Please send me a message in MyChart with any questions or updates.  Please see me back in 4 weeks.   --Dr. Raeford Razor

## 2019-04-29 NOTE — Progress Notes (Signed)
Christina [name] - 58 y.o. female MRN [id number]  Date of birth: [birth year]  SUBJECTIVE:  Including CC & ROS.  Chief Complaint  Patient presents with  . Leg Pain    bilateral leg    Christina [name] is a 58 y.o. female that is presenting with left and right leg pain. The pain is only occurring at night. The pain is in a stocking like distribution.  She has been sitting for long periods of time.  She only has the symptoms at night.  She has never had symptoms like this previously.  The pain is only been occurring for the past 2 weeks.  She denies any new or different activities.  She was worked up for her elevated liver enzymes.  Her hepatitis panel was negative.  She denies any symptoms to the course of the day.   Review of Systems  Constitutional: Negative for fever.  HENT: Negative for congestion.   Respiratory: Negative for cough.   Cardiovascular: Negative for chest pain.  Gastrointestinal: Negative for abdominal pain.  Musculoskeletal: Negative for gait problem.  Skin: Negative for color change.  Neurological: Negative for weakness.  Hematological: Negative for adenopathy.    HISTORY: Past Medical, Surgical, Social, and Family History Reviewed & Updated per EMR.   Pertinent Historical Findings include:  Past Medical History:  Diagnosis Date  . GERD (gastroesophageal reflux disease)   . Hypertension     Past Surgical History:  Procedure Laterality Date  . CESAREAN SECTION    . CHOLECYSTECTOMY      Allergies  Allergen Reactions  . Buprenorphine Hcl Itching  . Iodinated Diagnostic Agents Rash    Other reaction(s): Rash   . Morphine And Related Itching    Other reaction(s): Itching     Family History  Problem Relation Age of Onset  . CAD Father   . Hypertension Father   . Diabetes Mother   . Hypertension Mother   . Diabetes Sister   . Hypertension Sister   . Diabetes Brother   . Hypertension Brother   . Hyperlipidemia Neg Hx      Social History    Socioeconomic History  . Marital status: Married    Spouse name: Not on file  . Number of children: Not on file  . Years of education: Not on file  . Highest education level: Not on file  Occupational History  . Not on file  Social Needs  . Financial resource strain: Not on file  . Food insecurity    Worry: Not on file    Inability: Not on file  . Transportation needs    Medical: Not on file    Non-medical: Not on file  Tobacco Use  . Smoking status: Never Smoker  . Smokeless tobacco: Never Used  Substance and Sexual Activity  . Alcohol use: Not Currently    Alcohol/week: 0.0 standard drinks  . Drug use: No  . Sexual activity: Not on file  Lifestyle  . Physical activity    Days per week: Not on file    Minutes per session: Not on file  . Stress: Not on file  Relationships  . Social Herbalist on phone: Not on file    Gets together: Not on file    Attends religious service: Not on file    Active member of club or organization: Not on file    Attends meetings of clubs or organizations: Not on file    Relationship status: Not on file  .  Intimate partner violence    Fear of current or ex partner: Not on file    Emotionally abused: Not on file    Physically abused: Not on file    Forced sexual activity: Not on file  Other Topics Concern  . Not on file  Social History Narrative  . Not on file     PHYSICAL EXAM:  VS: BP (!) 156/84   Pulse 75   Ht 5\' 5"  (1.651 m)   Wt 217 lb (98.4 kg)   BMI 36.11 kg/m  Physical Exam Gen: NAD, alert, cooperative with exam, well-appearing ENT: normal lips, normal nasal mucosa,  Eye: normal EOM, normal conjunctiva and lids CV:  no edema, +2 pedal pulses   Resp: no accessory muscle use, non-labored,   Skin: no rashes, no areas of induration  Neuro: normal tone, normal sensation to touch Psych:  normal insight, alert and oriented MSK:  Right and left leg: No ecchymosis. No signs of atrophy. Normal range of motion of  the knees. Normal plantarflexion and dorsiflexion. Normal strength resistance. +2 deep tendon reflexes at the patella and Achilles. Normal gait. Neurovascularly intact     ASSESSMENT & PLAN:   Bilateral leg pain Pain is ongoing for the past 2 weeks. Seems to be worse at night. Hepatitis panel was negative. Has not re-checked liver enzymes. Unclear if this is related to her sitting or possible RLS.  - prednisone  - robaxin  - counseled to avoid sitting for longer periods of time  - if no improvement consider EMG

## 2019-04-29 NOTE — Assessment & Plan Note (Signed)
Pain is ongoing for the past 2 weeks. Seems to be worse at night. Hepatitis panel was negative. Has not re-checked liver enzymes. Unclear if this is related to her sitting or possible RLS.  - prednisone  - robaxin  - counseled to avoid sitting for longer periods of time  - if no improvement consider EMG

## 2019-09-20 ENCOUNTER — Ambulatory Visit: Payer: BC Managed Care – PPO

## 2019-09-21 ENCOUNTER — Ambulatory Visit: Payer: Self-pay | Attending: Internal Medicine

## 2019-09-21 DIAGNOSIS — Z23 Encounter for immunization: Secondary | ICD-10-CM

## 2019-09-21 NOTE — Progress Notes (Signed)
   Covid-19 Vaccination Clinic  Name:  Hermila Millis    MRN: [id number] DOB: September 13, 1960  09/21/2019  Ms. Ureste was observed post Covid-19 immunization for 15 minutes without incident. She was provided with Vaccine Information Sheet and instruction to access the V-Safe system.   Ms. Sklar was instructed to call 911 with any severe reactions post vaccine: Marland Kitchen Difficulty breathing  . Swelling of face and throat  . A fast heartbeat  . A bad rash all over body  . Dizziness and weakness   Immunizations Administered    Name Date Dose VIS Date Route   Pfizer COVID-19 Vaccine 09/21/2019  4:20 PM 0.3 mL 05/24/2019 Intramuscular   Manufacturer: [employer], [employer]   Lot: [id number]   NDC: [address]-0

## 2019-10-15 ENCOUNTER — Ambulatory Visit: Payer: Self-pay | Attending: Internal Medicine

## 2019-10-15 DIAGNOSIS — Z23 Encounter for immunization: Secondary | ICD-10-CM

## 2019-10-15 NOTE — Progress Notes (Signed)
   Covid-19 Vaccination Clinic  Name:  Emmakate Hypes    MRN: [id number] DOB: 03-16-1961  10/15/2019  Ms. Livengood was observed post Covid-19 immunization for 15 minutes without incident. She was provided with Vaccine Information Sheet and instruction to access the V-Safe system.   Ms. Harbin was instructed to call 911 with any severe reactions post vaccine: Marland Kitchen Difficulty breathing  . Swelling of face and throat  . A fast heartbeat  . A bad rash all over body  . Dizziness and weakness   Immunizations Administered    Name Date Dose VIS Date Route   Pfizer COVID-19 Vaccine 10/15/2019  9:56 AM 0.3 mL 08/07/2018 Intramuscular   Manufacturer: [employer], [employer]   Lot: [id number]   NDC: [address]-3

## 2020-03-10 ENCOUNTER — Emergency Department (HOSPITAL_BASED_OUTPATIENT_CLINIC_OR_DEPARTMENT_OTHER)
Admission: EM | Admit: 2020-03-10 | Discharge: 2020-03-10 | Disposition: A | Discharge status: Home / Self Care | Payer: BC Managed Care – PPO | Attending: Emergency Medicine | Admitting: Emergency Medicine

## 2020-03-10 ENCOUNTER — Encounter (HOSPITAL_BASED_OUTPATIENT_CLINIC_OR_DEPARTMENT_OTHER): Payer: Self-pay | Admitting: *Deleted

## 2020-03-10 ENCOUNTER — Other Ambulatory Visit: Payer: Self-pay

## 2020-03-10 ENCOUNTER — Emergency Department (HOSPITAL_BASED_OUTPATIENT_CLINIC_OR_DEPARTMENT_OTHER): Payer: BC Managed Care – PPO

## 2020-03-10 DIAGNOSIS — Z79899 Other long term (current) drug therapy: Secondary | ICD-10-CM | POA: Diagnosis not present

## 2020-03-10 DIAGNOSIS — I10 Essential (primary) hypertension: Secondary | ICD-10-CM | POA: Insufficient documentation

## 2020-03-10 DIAGNOSIS — R0789 Other chest pain: Secondary | ICD-10-CM | POA: Diagnosis not present

## 2020-03-10 DIAGNOSIS — R079 Chest pain, unspecified: Secondary | ICD-10-CM

## 2020-03-10 LAB — CBC WITH DIFFERENTIAL/PLATELET
Abs Immature Granulocytes: 0.02 10*3/uL (ref 0.00–0.07)
Basophils Absolute: 0.1 10*3/uL (ref 0.0–0.1)
Basophils Relative: 1 %
Eosinophils Absolute: 0.1 10*3/uL (ref 0.0–0.5)
Eosinophils Relative: 2 %
HCT: 40.6 % (ref 36.0–46.0)
Hemoglobin: 12.8 g/dL (ref 12.0–15.0)
Immature Granulocytes: 0 %
Lymphocytes Relative: 47 %
Lymphs Abs: 4 10*3/uL (ref 0.7–4.0)
MCH: 26.6 pg (ref 26.0–34.0)
MCHC: 31.5 g/dL (ref 30.0–36.0)
MCV: 84.2 fL (ref 80.0–100.0)
Monocytes Absolute: 0.5 10*3/uL (ref 0.1–1.0)
Monocytes Relative: 6 %
Neutro Abs: 3.7 10*3/uL (ref 1.7–7.7)
Neutrophils Relative %: 44 %
Platelets: 269 10*3/uL (ref 150–400)
RBC: 4.82 MIL/uL (ref 3.87–5.11)
RDW: 13.5 % (ref 11.5–15.5)
WBC: 8.4 10*3/uL (ref 4.0–10.5)
nRBC: 0 % (ref 0.0–0.2)

## 2020-03-10 LAB — BASIC METABOLIC PANEL
Anion gap: 8 (ref 5–15)
BUN: 12 mg/dL (ref 6–20)
CO2: 24 mmol/L (ref 22–32)
Calcium: 9.1 mg/dL (ref 8.9–10.3)
Chloride: 104 mmol/L (ref 98–111)
Creatinine, Ser: 0.6 mg/dL (ref 0.44–1.00)
GFR calc Af Amer: >60 mL/min (ref >60)
GFR calc non Af Amer: >60 mL/min (ref >60)
Glucose, Bld: 106 mg/dL — ABNORMAL HIGH (ref 70–99)
Potassium: 3.6 mmol/L (ref 3.5–5.1)
Sodium: 136 mmol/L (ref 135–145)

## 2020-03-10 LAB — TROPONIN I (HIGH SENSITIVITY): Troponin I (High Sensitivity): 4 ng/L (ref <18)

## 2020-03-10 MED ORDER — ACETAMINOPHEN 325 MG PO TABS
650.0000 mg | ORAL_TABLET | Freq: Once | ORAL | Status: AC
  Administered 2020-03-10: 650 mg via ORAL
  Filled 2020-03-10: qty 2

## 2020-03-10 NOTE — ED Triage Notes (Signed)
Pinpoint tenderness to left chest x 3 days. Denies known injury. Denies N/V/Diaphoresis, denies SOB.

## 2020-03-10 NOTE — ED Notes (Signed)
Pt on monitor 

## 2020-03-10 NOTE — ED Provider Notes (Signed)
[facility] EMERGENCY DEPARTMENT Provider Note   CSN: [id number] Arrival date & time: 03/10/20  1047     History Chief Complaint  Patient presents with  . Chest Pain    Christina [name] is a 59 y.o. female.  The history is provided by the patient.  Chest Pain Pain location:  L chest Pain quality: aching   Pain radiates to:  Does not radiate Pain severity:  Mild Onset quality:  Gradual Duration:  3 days Timing:  Intermittent Progression:  Waxing and waning Context: raising an arm   Relieved by:  Nothing Worsened by:  Nothing Associated symptoms: no abdominal pain, no back pain, no cough, no fever, no palpitations, no shortness of breath and no vomiting   Risk factors: hypertension   Risk factors: no coronary artery disease, no high cholesterol and no prior DVT/PE        Past Medical History:  Diagnosis Date  . GERD (gastroesophageal reflux disease)   . Hypertension     Patient Active Problem List   Diagnosis Date Noted  . Bilateral leg pain 04/29/2019  . Lumbar radiculopathy 01/23/2019  . Plantar fasciitis, left 01/08/2015  . AD (atopic dermatitis) 07/30/2014  . Left ankle sprain 12/30/2013  . Right foot injury 07/10/2013  . Pancreatitis, acute 11/02/2012  . Chest pain 11/01/2012  . Hypertension, accelerated 11/01/2012  . HYPERLIPIDEMIA 09/29/2008  . HYPERTENSION 09/29/2008    Past Surgical History:  Procedure Laterality Date  . CESAREAN SECTION    . CHOLECYSTECTOMY       OB History   No obstetric history on file.     Family History  Problem Relation Age of Onset  . CAD Father   . Hypertension Father   . Diabetes Mother   . Hypertension Mother   . Diabetes Sister   . Hypertension Sister   . Diabetes Brother   . Hypertension Brother   . Hyperlipidemia Neg Hx     Social History   Tobacco Use  . Smoking status: Never Smoker  . Smokeless tobacco: Never Used  Vaping Use  . Vaping Use: Never used  Substance Use Topics  .  Alcohol use: Not Currently    Alcohol/week: 0.0 standard drinks  . Drug use: No    Home Medications Prior to Admission medications   Medication Sig Start Date End Date Taking? Authorizing Provider  famotidine (PEPCID) 20 MG tablet Take 1 tablet (20 mg total) by mouth 2 (two) times daily. 09/17/17   Khatri, Hina, PA-C  gabapentin (NEURONTIN) 100 MG capsule Take 1 capsule (100 mg total) by mouth 3 (three) times daily. 01/23/19   Myra Rude, MD  HYDROcodone-acetaminophen (NORCO/VICODIN) 5-325 MG tablet Take 1 tablet by mouth every 6 (six) hours as needed for severe pain. 01/05/19   Henderly, Britni A, PA-C  lidocaine (LIDODERM) 5 % Place 1 patch onto the skin daily. Remove & Discard patch within 12 hours or as directed by MD 01/05/19   Henderly, Britni A, PA-C  lisinopril (PRINIVIL,ZESTRIL) 20 MG tablet  04/06/18   [provider]  methocarbamol (ROBAXIN) 500 MG tablet Take 1 tablet (500 mg total) by mouth 2 (two) times daily as needed for muscle spasms. 04/29/19   Myra Rude, MD  omeprazole (PRILOSEC) 20 MG capsule Take 2 capsules (40 mg total) by mouth 2 (two) times daily before a meal. 05/06/15   Joy, Shawn C, PA-C  pantoprazole (PROTONIX) 40 MG tablet  02/12/18   [provider]  predniSONE (DELTASONE) 5  MG tablet Take 6 pills for first day, 5 pills second day, 4 pills third day, 3 pills fourth day, 2 pills the fifth day, and 1 pill sixth day. 04/29/19   Myra Rude, MD  sucralfate (CARAFATE) 1 GM/10ML suspension Take 10 mLs (1 g total) by mouth 4 (four) times daily -  with meals and at bedtime. 09/17/17   Khatri, Hina, PA-C    Allergies    Buprenorphine hcl, Iodinated diagnostic agents, and Morphine and related  Review of Systems   Review of Systems  Constitutional: Negative for chills and fever.  HENT: Negative for ear pain and sore throat.   Eyes: Negative for pain and visual disturbance.  Respiratory: Negative for cough and shortness of breath.     Cardiovascular: Positive for chest pain. Negative for palpitations.  Gastrointestinal: Negative for abdominal pain and vomiting.  Genitourinary: Negative for dysuria and hematuria.  Musculoskeletal: Negative for arthralgias and back pain.  Skin: Negative for color change and rash.  Neurological: Negative for seizures and syncope.  All other systems reviewed and are negative.   Physical Exam Updated Vital Signs  ED Triage Vitals [03/10/20 1058]  Enc Vitals Group     BP      Pulse Rate 83     Resp 18     Temp      Temp Source Oral     SpO2 100 %     Weight      Height      Head Circumference      Peak Flow      Pain Score      Pain Loc      Pain Edu?      Excl. in GC?     Physical Exam Vitals and nursing note reviewed.  Constitutional:      General: She is not in acute distress.    Appearance: She is well-developed.  HENT:     Head: Normocephalic and atraumatic.  Eyes:     Conjunctiva/sclera: Conjunctivae normal.  Cardiovascular:     Rate and Rhythm: Normal rate and regular rhythm.     Pulses:          Radial pulses are 2+ on the right side and 2+ on the left side.     Heart sounds: No murmur heard.   Pulmonary:     Effort: Pulmonary effort is normal. No respiratory distress.     Breath sounds: Normal breath sounds. No decreased breath sounds, wheezing or rhonchi.  Chest:     Chest wall: Tenderness (TTP left chest wall) present.  Abdominal:     Palpations: Abdomen is soft.     Tenderness: There is no abdominal tenderness.  Musculoskeletal:        General: Normal range of motion.     Cervical back: Neck supple.     Right lower leg: No edema.     Left lower leg: No edema.  Skin:    General: Skin is warm and dry.     Capillary Refill: Capillary refill takes less than 2 seconds.  Neurological:     General: No focal deficit present.     Mental Status: She is alert.     ED Results / Procedures / Treatments   Labs (all labs ordered are listed, but only  abnormal results are displayed) Labs Reviewed  BASIC METABOLIC PANEL - Abnormal; Notable for the following components:      Result Value   Glucose, Bld 106 (*)  All other components within normal limits  CBC WITH DIFFERENTIAL/PLATELET  TROPONIN I (HIGH SENSITIVITY)    EKG EKG Interpretation  Date/Time:  Tuesday March 10 2020 11:10:33 EDT Ventricular Rate:  85 PR Interval:    QRS Duration: 134 QT Interval:  398 QTC Calculation: 474 R Axis:   69 Text Interpretation: Sinus rhythm Right bundle branch block No significant change since last tracing Confirmed by Virgina Norfolk [phone]) on 03/10/2020 11:14:41 AM   Radiology DG Chest Portable 1 View  Result Date: 03/10/2020 CLINICAL DATA:  Left-sided chest pain. EXAM: PORTABLE CHEST 1 VIEW COMPARISON:  04/09/2014. FINDINGS: Mediastinum and hilar structures normal. Heart size normal. No focal infiltrate. No pleural effusion or pneumothorax. No acute bony abnormality. IMPRESSION: No acute cardiopulmonary disease. Electronically Signed   By: Maisie Fus  Register   On: 03/10/2020 11:44    Procedures Procedures (including critical care time)  Medications Ordered in ED Medications  acetaminophen (TYLENOL) tablet 650 mg (650 mg Oral Given 03/10/20 1154)    ED Course  I have reviewed the triage vital signs and the nursing notes.  Pertinent labs & imaging results that were available during my care of the patient were reviewed by me and considered in my medical decision making (see chart for details).    MDM Rules/Calculators/A&P                          Adabella Stanis is a 59 year old female history of hypertension who presents to the ED with chest pain.  Unremarkable vitals.  No fever.  Chest wall pain on and off for the last 3 days.  Worse when she does physical activities especially with the left upper arm.  Point tender in the left chest wall on exam.  Clear breath sounds.  No PE risk factors and doubt PE.  However given  hypertension/obesity history we will check troponin, basic labs, EKG, chest x-ray.  Overall, suspect musculoskeletal source.  EKG shows sinus rhythm.  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  No ischemic changes.  Troponin normal.  No significant anemia, electrolyte abnormality, kidney injury.  Overall work-up unremarkable.  We will have her follow-up with primary care doctor.  Recommend Tylenol Motrin for pain.  This chart was dictated using voice recognition software.  Despite best efforts to proofread,  errors can occur which can change the documentation meaning.    Final Clinical Impression(s) / ED Diagnoses Final diagnoses:  Nonspecific chest pain    Rx / DC Orders ED Discharge Orders    None       Virgina Norfolk, DO 03/10/20 1208

## 2020-07-05 ENCOUNTER — Emergency Department (HOSPITAL_BASED_OUTPATIENT_CLINIC_OR_DEPARTMENT_OTHER): Payer: BC Managed Care – PPO

## 2020-07-05 ENCOUNTER — Encounter (HOSPITAL_BASED_OUTPATIENT_CLINIC_OR_DEPARTMENT_OTHER): Payer: Self-pay

## 2020-07-05 ENCOUNTER — Other Ambulatory Visit: Payer: Self-pay

## 2020-07-05 ENCOUNTER — Emergency Department (HOSPITAL_BASED_OUTPATIENT_CLINIC_OR_DEPARTMENT_OTHER)
Admission: EM | Admit: 2020-07-05 | Discharge: 2020-07-05 | Disposition: A | Discharge status: Home / Self Care | Payer: BC Managed Care – PPO | Attending: Emergency Medicine | Admitting: Emergency Medicine

## 2020-07-05 DIAGNOSIS — M542 Cervicalgia: Secondary | ICD-10-CM | POA: Diagnosis not present

## 2020-07-05 DIAGNOSIS — J069 Acute upper respiratory infection, unspecified: Secondary | ICD-10-CM | POA: Insufficient documentation

## 2020-07-05 DIAGNOSIS — Z79899 Other long term (current) drug therapy: Secondary | ICD-10-CM | POA: Insufficient documentation

## 2020-07-05 DIAGNOSIS — I10 Essential (primary) hypertension: Secondary | ICD-10-CM | POA: Diagnosis not present

## 2020-07-05 DIAGNOSIS — R059 Cough, unspecified: Secondary | ICD-10-CM | POA: Diagnosis present

## 2020-07-05 MED ORDER — BENZONATATE 100 MG PO CAPS: 100.0000 mg | ORAL_CAPSULE | Freq: Three times a day (TID) | ORAL | 0 refills | Status: DC

## 2020-07-05 NOTE — Discharge Instructions (Signed)
Please read instructions below.  You can take tylenol or ibuprofen as needed for sore throat or fever.  Drink plenty of water.  Use saline nasal spray for congestion. You can take tessalon every 8 hours for cough. Follow up with your primary care provider as needed.  Stay home until your symptoms are better. Return to the ER for inability to swallow liquids, difficulty breathing, or new or worsening symptoms.

## 2020-07-05 NOTE — ED Provider Notes (Signed)
[facility] EMERGENCY DEPARTMENT Provider Note   CSN: [id number] Arrival date & time: 07/05/20  1414     History Chief Complaint  Patient presents with  . Cough    Odeal Welden is a 60 y.o. female past medical history of hypertension, hyperlipidemia, presenting to the emergency department with complaint of 5 days of dry cough.  She has no associated fevers, sore throat, abdominal pain, nausea, vomiting, diarrhea.  She is having some left-sided neck soreness that is worse when she swallows.  No ear pain. She is not short of breath or having chest pain.  She is treating with Delsym for cough.   She is vaccinated against COVID.  Has no known COVID exposure.  Her husband is ill with the same symptoms.   The history is provided by the patient.       Past Medical History:  Diagnosis Date  . GERD (gastroesophageal reflux disease)   . Hypertension     Patient Active Problem List   Diagnosis Date Noted  . Bilateral leg pain 04/29/2019  . Lumbar radiculopathy 01/23/2019  . Plantar fasciitis, left 01/08/2015  . AD (atopic dermatitis) 07/30/2014  . Left ankle sprain 12/30/2013  . Right foot injury 07/10/2013  . Pancreatitis, acute 11/02/2012  . Chest pain 11/01/2012  . Hypertension, accelerated 11/01/2012  . HYPERLIPIDEMIA 09/29/2008  . HYPERTENSION 09/29/2008    Past Surgical History:  Procedure Laterality Date  . CESAREAN SECTION    . CHOLECYSTECTOMY       OB History   No obstetric history on file.     Family History  Problem Relation Age of Onset  . CAD Father   . Hypertension Father   . Diabetes Mother   . Hypertension Mother   . Diabetes Sister   . Hypertension Sister   . Diabetes Brother   . Hypertension Brother   . Hyperlipidemia Neg Hx     Social History   Tobacco Use  . Smoking status: Never Smoker  . Smokeless tobacco: Never Used  Vaping Use  . Vaping Use: Never used  Substance Use Topics  . Alcohol use: Not Currently     Alcohol/week: 0.0 standard drinks  . Drug use: No    Home Medications Prior to Admission medications   Medication Sig Start Date End Date Taking? Authorizing Provider  benzonatate (TESSALON) 100 MG capsule Take 1 capsule (100 mg total) by mouth every 8 (eight) hours. 07/05/20  Yes Lucile Hillmann, [place] N, PA-C  famotidine (PEPCID) 20 MG tablet Take 1 tablet (20 mg total) by mouth 2 (two) times daily. 09/17/17   Khatri, Hina, PA-C  gabapentin (NEURONTIN) 100 MG capsule Take 1 capsule (100 mg total) by mouth 3 (three) times daily. 01/23/19   Myra Rude, MD  HYDROcodone-acetaminophen (NORCO/VICODIN) 5-325 MG tablet Take 1 tablet by mouth every 6 (six) hours as needed for severe pain. 01/05/19   Henderly, Britni A, PA-C  lidocaine (LIDODERM) 5 % Place 1 patch onto the skin daily. Remove & Discard patch within 12 hours or as directed by MD 01/05/19   Henderly, Britni A, PA-C  lisinopril (PRINIVIL,ZESTRIL) 20 MG tablet  04/06/18   [provider]  methocarbamol (ROBAXIN) 500 MG tablet Take 1 tablet (500 mg total) by mouth 2 (two) times daily as needed for muscle spasms. 04/29/19   Myra Rude, MD  omeprazole (PRILOSEC) 20 MG capsule Take 2 capsules (40 mg total) by mouth 2 (two) times daily before a meal. 05/06/15   Joy, Ines Bloomer  C, PA-C  pantoprazole (PROTONIX) 40 MG tablet  02/12/18   [provider]  predniSONE (DELTASONE) 5 MG tablet Take 6 pills for first day, 5 pills second day, 4 pills third day, 3 pills fourth day, 2 pills the fifth day, and 1 pill sixth day. 04/29/19   Myra Rude, MD  sucralfate (CARAFATE) 1 GM/10ML suspension Take 10 mLs (1 g total) by mouth 4 (four) times daily -  with meals and at bedtime. 09/17/17   Khatri, Hina, PA-C    Allergies    Buprenorphine hcl, Iodinated diagnostic agents, and Morphine and related  Review of Systems   Review of Systems  All other systems reviewed and are negative.   Physical Exam Updated Vital Signs BP (!) 158/78 (BP  Location: Right Arm)   Pulse 60   Temp 98 F (36.7 C) (Oral)   Resp 16   Ht 5\' 1"  (1.549 m)   Wt 100.2 kg   SpO2 100%   BMI 41.76 kg/m   Physical Exam Vitals and nursing note reviewed.  Constitutional:      General: She is not in acute distress.    Appearance: She is well-developed. She is not ill-appearing.  HENT:     Head: Normocephalic and atraumatic.     Right Ear: Tympanic membrane and ear canal normal.     Left Ear: Tympanic membrane and ear canal normal.     Mouth/Throat:     Mouth: Mucous membranes are moist.     Pharynx: Oropharynx is clear. No oropharyngeal exudate or posterior oropharyngeal erythema.     Comments: Uvula is midline, no trismus.  Tolerating secretions Eyes:     Conjunctiva/sclera: Conjunctivae normal.  Cardiovascular:     Rate and Rhythm: Normal rate and regular rhythm.  Pulmonary:     Effort: Pulmonary effort is normal. No respiratory distress.     Breath sounds: Normal breath sounds.  Musculoskeletal:     Cervical back: Normal range of motion and neck supple. Tenderness (Tenderness along the left sternocleidomastoid muscle) present.  Lymphadenopathy:     Cervical: No cervical adenopathy.  Neurological:     Mental Status: She is alert.  Psychiatric:        Mood and Affect: Mood normal.        Behavior: Behavior normal.     ED Results / Procedures / Treatments   Labs (all labs ordered are listed, but only abnormal results are displayed) Labs Reviewed - No data to display  EKG None  Radiology DG Chest Portable 1 View  Result Date: 07/05/2020 CLINICAL DATA:  Cough, intermittent shortness of breath for 5 days EXAM: PORTABLE CHEST 1 VIEW COMPARISON:  03/10/2020 FINDINGS: The heart size and mediastinal contours are within normal limits. Both lungs are clear. The visualized skeletal structures are unremarkable. IMPRESSION: No active disease. Electronically Signed   By: 03/12/2020 M.D.   On: 07/05/2020 16:06    Procedures Procedures  (including critical care time)  Medications Ordered in ED Medications - No data to display  ED Course  I have reviewed the triage vital signs and the nursing notes.  Pertinent labs & imaging results that were available during my care of the patient were reviewed by me and considered in my medical decision making (see chart for details).    MDM Rules/Calculators/A&P                         Cylie Dor was evaluated in Emergency Department  on 07/05/2020 for the symptoms described in the history of present illness. She was evaluated in the context of the global COVID-19 pandemic, which necessitated consideration that the patient might be at risk for infection with the SARS-CoV-2 virus that causes COVID-19. Institutional protocols and algorithms that pertain to the evaluation of patients at risk for COVID-19 are in a state of rapid change based on information released by regulatory bodies including the CDC and federal and state organizations. These policies and algorithms were followed during the patient's care in the ED.   Patients symptoms are consistent with URI, likely viral etiology. Afebrile, tolerating secretions.  Lungs clear to auscultation bilaterally. CXR negative for acute infiltrate.  Also consider COVID-19.  However, patient refused swab.  Recommend home isolation precautions.  Discussed that antibiotics are not indicated for viral infections. Pt will be discharged with symptomatic treatment.  Verbalizes understanding and is agreeable with plan. Pt is hemodynamically stable & in NAD prior to dc.  Discussed results, findings, treatment and follow up. Patient advised of return precautions. Patient verbalized understanding and agreed with plan.  Final Clinical Impression(s) / ED Diagnoses Final diagnoses:  Viral URI with cough    Rx / DC Orders ED Discharge Orders         Ordered    benzonatate (TESSALON) 100 MG capsule  Every 8 hours        07/05/20 2141            Geonna Lockyer, [place] N, PA-C 07/05/20 2144    Charlynne Pander, MD 07/08/20 1047

## 2020-07-05 NOTE — ED Triage Notes (Signed)
Pt complaining of cough and left lateral neck pain. Denies chest pain, states sometime short of breath when coughing. Tried home cough medicine.

## 2021-03-29 ENCOUNTER — Encounter (HOSPITAL_BASED_OUTPATIENT_CLINIC_OR_DEPARTMENT_OTHER): Payer: Self-pay | Admitting: Emergency Medicine

## 2021-03-29 ENCOUNTER — Other Ambulatory Visit: Payer: Self-pay

## 2021-03-29 DIAGNOSIS — I10 Essential (primary) hypertension: Secondary | ICD-10-CM | POA: Insufficient documentation

## 2021-03-29 DIAGNOSIS — Z79899 Other long term (current) drug therapy: Secondary | ICD-10-CM | POA: Insufficient documentation

## 2021-03-29 DIAGNOSIS — R197 Diarrhea, unspecified: Secondary | ICD-10-CM | POA: Insufficient documentation

## 2021-03-29 DIAGNOSIS — R110 Nausea: Secondary | ICD-10-CM | POA: Insufficient documentation

## 2021-03-29 DIAGNOSIS — R1013 Epigastric pain: Secondary | ICD-10-CM | POA: Insufficient documentation

## 2021-03-29 DIAGNOSIS — K219 Gastro-esophageal reflux disease without esophagitis: Secondary | ICD-10-CM | POA: Insufficient documentation

## 2021-03-29 LAB — COMPREHENSIVE METABOLIC PANEL
ALT: 37 U/L (ref 0–44)
AST: 41 U/L (ref 15–41)
Albumin: 4.3 g/dL (ref 3.5–5.0)
Alkaline Phosphatase: 98 U/L (ref 38–126)
Anion gap: 9 (ref 5–15)
BUN: 18 mg/dL (ref 6–20)
CO2: 23 mmol/L (ref 22–32)
Calcium: 9.2 mg/dL (ref 8.9–10.3)
Chloride: 104 mmol/L (ref 98–111)
Creatinine, Ser: 0.97 mg/dL (ref 0.44–1.00)
GFR, Estimated: >60 mL/min (ref >60)
Glucose, Bld: 117 mg/dL — ABNORMAL HIGH (ref 70–99)
Potassium: 3.7 mmol/L (ref 3.5–5.1)
Sodium: 136 mmol/L (ref 135–145)
Total Bilirubin: 1.1 mg/dL (ref 0.3–1.2)
Total Protein: 7.6 g/dL (ref 6.5–8.1)

## 2021-03-29 LAB — URINALYSIS, ROUTINE W REFLEX MICROSCOPIC
Glucose, UA: NEGATIVE mg/dL
Hgb urine dipstick: NEGATIVE
Ketones, ur: NEGATIVE mg/dL
Leukocytes,Ua: NEGATIVE
Nitrite: NEGATIVE
Protein, ur: 100 mg/dL — AB
Specific Gravity, Urine: >1.03 — ABNORMAL HIGH (ref 1.005–1.030)
pH: 5.5 (ref 5.0–8.0)

## 2021-03-29 LAB — CBC
HCT: 42.7 % (ref 36.0–46.0)
Hemoglobin: 13.5 g/dL (ref 12.0–15.0)
MCH: 26.3 pg (ref 26.0–34.0)
MCHC: 31.6 g/dL (ref 30.0–36.0)
MCV: 83.1 fL (ref 80.0–100.0)
Platelets: 277 10*3/uL (ref 150–400)
RBC: 5.14 MIL/uL — ABNORMAL HIGH (ref 3.87–5.11)
RDW: 13.7 % (ref 11.5–15.5)
WBC: 8.8 10*3/uL (ref 4.0–10.5)
nRBC: 0 % (ref 0.0–0.2)

## 2021-03-29 LAB — URINALYSIS, MICROSCOPIC (REFLEX): WBC, UA: NONE SEEN WBC/hpf (ref 0–5)

## 2021-03-29 LAB — LIPASE, BLOOD: Lipase: 33 U/L (ref 11–51)

## 2021-03-29 LAB — TROPONIN I (HIGH SENSITIVITY): Troponin I (High Sensitivity): 5 ng/L (ref <18)

## 2021-03-29 LAB — PREGNANCY, URINE: Preg Test, Ur: NEGATIVE

## 2021-03-29 MED ORDER — ONDANSETRON 4 MG PO TBDP
4.0000 mg | ORAL_TABLET | Freq: Once | ORAL | Status: AC | PRN
  Administered 2021-03-29: 4 mg via ORAL
  Filled 2021-03-29: qty 1

## 2021-03-29 NOTE — ED Triage Notes (Signed)
Pt presents to ED POV. Pt c/o abd pain since yesterday. Pt reports that she ate dinner last night and pain and diarrhea began. Pt reports nausea and cp.

## 2021-03-30 ENCOUNTER — Emergency Department (HOSPITAL_BASED_OUTPATIENT_CLINIC_OR_DEPARTMENT_OTHER)
Admission: EM | Admit: 2021-03-30 | Discharge: 2021-03-30 | Disposition: A | Discharge status: Home / Self Care | Payer: BC Managed Care – PPO | Attending: Emergency Medicine | Admitting: Emergency Medicine

## 2021-03-30 ENCOUNTER — Emergency Department (HOSPITAL_BASED_OUTPATIENT_CLINIC_OR_DEPARTMENT_OTHER): Payer: BC Managed Care – PPO

## 2021-03-30 DIAGNOSIS — R197 Diarrhea, unspecified: Secondary | ICD-10-CM

## 2021-03-30 DIAGNOSIS — R1013 Epigastric pain: Secondary | ICD-10-CM

## 2021-03-30 MED ORDER — SODIUM CHLORIDE 0.9 % IV BOLUS: 1000.0000 mL | Freq: Once | INTRAVENOUS | Status: DC

## 2021-03-30 MED ORDER — PANTOPRAZOLE SODIUM 40 MG IV SOLR: 40.0000 mg | Freq: Once | INTRAVENOUS | Status: DC

## 2021-03-30 MED ORDER — ONDANSETRON HCL 4 MG/2ML IJ SOLN: 4.0000 mg | Freq: Once | INTRAMUSCULAR | Status: DC

## 2021-03-30 MED ORDER — DIPHENHYDRAMINE HCL 50 MG/ML IJ SOLN: 50.0000 mg | Freq: Once | INTRAMUSCULAR | Status: DC

## 2021-03-30 MED ORDER — DIPHENHYDRAMINE HCL 25 MG PO CAPS: 50.0000 mg | ORAL_CAPSULE | Freq: Once | ORAL | Status: DC

## 2021-03-30 MED ORDER — FENTANYL CITRATE PF 50 MCG/ML IJ SOSY: 50.0000 ug | PREFILLED_SYRINGE | Freq: Once | INTRAMUSCULAR | Status: DC

## 2021-03-30 MED ORDER — HYDROCORTISONE SOD SUC (PF) 100 MG IJ SOLR: 200.0000 mg | Freq: Once | INTRAMUSCULAR | Status: DC

## 2021-03-30 MED ORDER — LIDOCAINE VISCOUS HCL 2 % MT SOLN
15.0000 mL | Freq: Once | OROMUCOSAL | Status: AC
  Administered 2021-03-30: 15 mL via OROMUCOSAL
  Filled 2021-03-30: qty 15

## 2021-03-30 MED ORDER — HYDROCORTISONE SOD SUC (PF) 250 MG IJ SOLR: 200.0000 mg | Freq: Once | INTRAMUSCULAR | Status: DC

## 2021-03-30 MED ORDER — ALUM & MAG HYDROXIDE-SIMETH 200-200-20 MG/5ML PO SUSP
30.0000 mL | Freq: Once | ORAL | Status: AC
  Administered 2021-03-30: 30 mL via ORAL
  Filled 2021-03-30: qty 30

## 2021-03-30 NOTE — Discharge Instructions (Addendum)
You declined your second heart enzyme test today are leaving AGAINST MEDICAL ADVICE Continue Protonix.  Avoid alcohol, caffeine, NSAID medications, spicy foods.  Follow-up with your doctor.  Return to the ED with new or worsening symptoms

## 2021-03-30 NOTE — ED Provider Notes (Signed)
[facility] EMERGENCY DEPARTMENT Provider Note   CSN: [id number] Arrival date & time: 03/29/21  2236     History Chief Complaint  Patient presents with   Abdominal Pain    Christina [name] is a 60 y.o. female.  Patient presents with upper abdominal pain with diarrhea and nausea onset last evening.  She describes 5 or 6 episodes of nonbloody liquid watery stools after visiting her daughter who was hospitalized without GI symptoms.  Denies any black or bloody stools.  Has nausea but no vomiting.  Has epigastric pain that is sharp and constant radiates to her back and her chest.  Pain came on after eating dinner last night.  Chest pain has resolved but still having epigastric pain going to her back.  Previous cholecystectomy.  Does have a history of acid reflux with unknown ulcers. Does not think that she has ever had an endoscopy. No recent travel or sick contacts.  No recent antibiotic use.  No fevers.  No pain with urination or blood in the urine.  The history is provided by the patient.  Abdominal Pain Associated symptoms: diarrhea and nausea   Associated symptoms: no cough, no dysuria, no fever, no hematuria, no shortness of breath and no vomiting       Past Medical History:  Diagnosis Date   GERD (gastroesophageal reflux disease)    Hypertension     Patient Active Problem List   Diagnosis Date Noted   Bilateral leg pain 04/29/2019   Lumbar radiculopathy 01/23/2019   Plantar fasciitis, left 01/08/2015   AD (atopic dermatitis) 07/30/2014   Left ankle sprain 12/30/2013   Right foot injury 07/10/2013   Pancreatitis, acute 11/02/2012   Chest pain 11/01/2012   Hypertension, accelerated 11/01/2012   HYPERLIPIDEMIA 09/29/2008   HYPERTENSION 09/29/2008    Past Surgical History:  Procedure Laterality Date   CESAREAN SECTION     CHOLECYSTECTOMY       OB History   No obstetric history on file.     Family History  Problem Relation Age of Onset   CAD Father     Hypertension Father    Diabetes Mother    Hypertension Mother    Diabetes Sister    Hypertension Sister    Diabetes Brother    Hypertension Brother    Hyperlipidemia Neg Hx     Social History   Tobacco Use   Smoking status: Never   Smokeless tobacco: Never  Vaping Use   Vaping Use: Never used  Substance Use Topics   Alcohol use: Not Currently    Alcohol/week: 0.0 standard drinks   Drug use: No    Home Medications Prior to Admission medications   Medication Sig Start Date End Date Taking? Authorizing Provider  benzonatate (TESSALON) 100 MG capsule Take 1 capsule (100 mg total) by mouth every 8 (eight) hours. 07/05/20   Robinson, [place] N, PA-C  famotidine (PEPCID) 20 MG tablet Take 1 tablet (20 mg total) by mouth 2 (two) times daily. 09/17/17   Khatri, Hina, PA-C  gabapentin (NEURONTIN) 100 MG capsule Take 1 capsule (100 mg total) by mouth 3 (three) times daily. 01/23/19   Myra Rude, MD  HYDROcodone-acetaminophen (NORCO/VICODIN) 5-325 MG tablet Take 1 tablet by mouth every 6 (six) hours as needed for severe pain. 01/05/19   Henderly, Britni A, PA-C  lidocaine (LIDODERM) 5 % Place 1 patch onto the skin daily. Remove & Discard patch within 12 hours or as directed by MD 01/05/19   Henderly, Britni  A, PA-C  lisinopril (PRINIVIL,ZESTRIL) 20 MG tablet  04/06/18   [provider]  methocarbamol (ROBAXIN) 500 MG tablet Take 1 tablet (500 mg total) by mouth 2 (two) times daily as needed for muscle spasms. 04/29/19   Myra Rude, MD  omeprazole (PRILOSEC) 20 MG capsule Take 2 capsules (40 mg total) by mouth 2 (two) times daily before a meal. 05/06/15   Joy, Shawn C, PA-C  pantoprazole (PROTONIX) 40 MG tablet  02/12/18   [provider]  predniSONE (DELTASONE) 5 MG tablet Take 6 pills for first day, 5 pills second day, 4 pills third day, 3 pills fourth day, 2 pills the fifth day, and 1 pill sixth day. 04/29/19   Myra Rude, MD  sucralfate (CARAFATE) 1 GM/10ML  suspension Take 10 mLs (1 g total) by mouth 4 (four) times daily -  with meals and at bedtime. 09/17/17   Khatri, Hina, PA-C    Allergies    Buprenorphine hcl, Iodinated diagnostic agents, and Morphine and related  Review of Systems   Review of Systems  Constitutional:  Positive for activity change and appetite change. Negative for fever.  HENT:  Negative for congestion and rhinorrhea.   Respiratory:  Negative for cough, chest tightness and shortness of breath.   Gastrointestinal:  Positive for abdominal pain, diarrhea and nausea. Negative for vomiting.  Genitourinary:  Negative for dysuria and hematuria.  Musculoskeletal:  Negative for arthralgias and myalgias.  Skin:  Negative for rash.  Neurological:  Negative for dizziness, weakness and headaches.   all other systems are negative except as noted in the HPI and PMH.   Physical Exam Updated Vital Signs BP 125/82 (BP Location: Left Arm)   Pulse 83   Temp 98.3 F (36.8 C) (Oral)   Resp 18   Ht 5\' 5"  (1.651 m)   Wt 113.4 kg   SpO2 99%   BMI 41.60 kg/m   Physical Exam Vitals and nursing note reviewed.  Constitutional:      General: She is not in acute distress.    Appearance: She is well-developed. She is obese.  HENT:     Head: Normocephalic and atraumatic.     Mouth/Throat:     Pharynx: No oropharyngeal exudate.  Eyes:     Conjunctiva/sclera: Conjunctivae normal.     Pupils: Pupils are equal, round, and reactive to light.  Neck:     Comments: No meningismus. Cardiovascular:     Rate and Rhythm: Normal rate and regular rhythm.     Heart sounds: Normal heart sounds. No murmur heard. Pulmonary:     Effort: Pulmonary effort is normal. No respiratory distress.     Breath sounds: Normal breath sounds.  Abdominal:     Palpations: Abdomen is soft.     Tenderness: There is abdominal tenderness. There is no guarding or rebound.     Comments: Epigastric tenderness, no right upper quadrant tenderness  Musculoskeletal:         General: No tenderness. Normal range of motion.     Cervical back: Normal range of motion and neck supple.  Skin:    General: Skin is warm.  Neurological:     Mental Status: She is alert and oriented to person, place, and time.     Cranial Nerves: No cranial nerve deficit.     Motor: No abnormal muscle tone.     Coordination: Coordination normal.     Comments:  5/5 strength throughout. CN 2-12 intact.Equal grip strength.   Psychiatric:  Behavior: Behavior normal.    ED Results / Procedures / Treatments   Labs (all labs ordered are listed, but only abnormal results are displayed) Labs Reviewed  COMPREHENSIVE METABOLIC PANEL - Abnormal; Notable for the following components:      Result Value   Glucose, Bld 117 (*)    All other components within normal limits  CBC - Abnormal; Notable for the following components:   RBC 5.14 (*)    All other components within normal limits  URINALYSIS, ROUTINE W REFLEX MICROSCOPIC - Abnormal; Notable for the following components:   APPearance CLOUDY (*)    Specific Gravity, Urine >1.030 (*)    Bilirubin Urine SMALL (*)    Protein, ur 100 (*)    All other components within normal limits  URINALYSIS, MICROSCOPIC (REFLEX) - Abnormal; Notable for the following components:   Bacteria, UA RARE (*)    All other components within normal limits  LIPASE, BLOOD  PREGNANCY, URINE  OCCULT BLOOD X 1 CARD TO LAB, STOOL  TROPONIN I (HIGH SENSITIVITY)  TROPONIN I (HIGH SENSITIVITY)    EKG EKG Interpretation  Date/Time:  Monday March 29 2021 22:47:47 EDT Ventricular Rate:  82 PR Interval:  136 QRS Duration: 130 QT Interval:  398 QTC Calculation: 464 R Axis:   119 Text Interpretation: Normal sinus rhythm Right bundle branch block T wave abnormality, consider inferior ischemia Abnormal ECG No significant change was found Confirmed by Glynn Octave [id number]) on 03/30/2021 4:41:48 AM  Radiology No results found.  Procedures Procedures    Medications Ordered in ED Medications  sodium chloride 0.9 % bolus 1,000 mL (has no administration in time range)  pantoprazole (PROTONIX) injection 40 mg (has no administration in time range)  ondansetron (ZOFRAN) injection 4 mg (has no administration in time range)  fentaNYL (SUBLIMAZE) injection 50 mcg (has no administration in time range)  diphenhydrAMINE (BENADRYL) capsule 50 mg (has no administration in time range)    Or  diphenhydrAMINE (BENADRYL) injection 50 mg (has no administration in time range)  hydrocortisone sodium succinate (SOLU-CORTEF) 100 MG injection 200 mg (has no administration in time range)  ondansetron (ZOFRAN-ODT) disintegrating tablet 4 mg (4 mg Oral Given 03/29/21 2251)  alum & mag hydroxide-simeth (MAALOX/MYLANTA) 200-200-20 MG/5ML suspension 30 mL (30 mLs Oral Given 03/30/21 0134)  lidocaine (XYLOCAINE) 2 % viscous mouth solution 15 mL (15 mLs Mouth/Throat Given 03/30/21 0134)    ED Course  I have reviewed the triage vital signs and the nursing notes.  Pertinent labs & imaging results that were available during my care of the patient were reviewed by me and considered in my medical decision making (see chart for details).    MDM Rules/Calculators/A&P                           Upper abdominal pain with diarrhea.  Had some fleeting chest pain earlier which has since resolved.  Denies any history of ulcers or acid reflux.  However she is on a PPI at home.  Does not think she is ever had an endoscopy. EKG is right bundle branch block and unchanged.  Labs show normal LFTs and lipase  EKG is unchanged.  First troponin is negative.  The patient does have IV contrast allergy.  She refuses IV and she refuses IV medications. Noncontrast CT scan shows no acute pathology.    Low suspicion for ACS, PE, aortic dissection.  Likely gastritis or esophagitis.  She has previous cholecystectomy.  Patient feels improved with GI cocktail. Her second troponin was  inadvertently not drawn during her evaluation.  She is refusing this test.  She understands that cannot rule out cardiac etiology of her pain completely.  She appears to have capacity to make this decision and states she feels better and wants to go home.  She will leave against medical advice and understands that she is free to return at any time Final Clinical Impression(s) / ED Diagnoses Final diagnoses:  Epigastric pain  Diarrhea, unspecified type    Rx / DC Orders ED Discharge Orders     None        Milany Geck, Jeannett Senior, MD 03/30/21 [phone]

## 2021-03-30 NOTE — ED Notes (Signed)
Pt refuses tretment and lab tests , pt leaving AMA.

## 2021-03-30 NOTE — ED Notes (Signed)
Pt tolerating water, no nausea reported

## 2021-04-21 ENCOUNTER — Emergency Department (HOSPITAL_BASED_OUTPATIENT_CLINIC_OR_DEPARTMENT_OTHER): Payer: BC Managed Care – PPO

## 2021-04-21 ENCOUNTER — Emergency Department (HOSPITAL_BASED_OUTPATIENT_CLINIC_OR_DEPARTMENT_OTHER)
Admission: EM | Admit: 2021-04-21 | Discharge: 2021-04-22 | Disposition: A | Discharge status: Home / Self Care | Payer: BC Managed Care – PPO | Attending: Emergency Medicine | Admitting: Emergency Medicine

## 2021-04-21 ENCOUNTER — Other Ambulatory Visit: Payer: Self-pay

## 2021-04-21 ENCOUNTER — Encounter (HOSPITAL_BASED_OUTPATIENT_CLINIC_OR_DEPARTMENT_OTHER): Payer: Self-pay

## 2021-04-21 DIAGNOSIS — Z79899 Other long term (current) drug therapy: Secondary | ICD-10-CM | POA: Insufficient documentation

## 2021-04-21 DIAGNOSIS — S39012A Strain of muscle, fascia and tendon of lower back, initial encounter: Secondary | ICD-10-CM | POA: Diagnosis not present

## 2021-04-21 DIAGNOSIS — F419 Anxiety disorder, unspecified: Secondary | ICD-10-CM | POA: Insufficient documentation

## 2021-04-21 DIAGNOSIS — S161XXA Strain of muscle, fascia and tendon at neck level, initial encounter: Secondary | ICD-10-CM | POA: Diagnosis not present

## 2021-04-21 DIAGNOSIS — I10 Essential (primary) hypertension: Secondary | ICD-10-CM | POA: Insufficient documentation

## 2021-04-21 DIAGNOSIS — Y9389 Activity, other specified: Secondary | ICD-10-CM | POA: Insufficient documentation

## 2021-04-21 DIAGNOSIS — Y92410 Unspecified street and highway as the place of occurrence of the external cause: Secondary | ICD-10-CM | POA: Insufficient documentation

## 2021-04-21 DIAGNOSIS — S3992XA Unspecified injury of lower back, initial encounter: Secondary | ICD-10-CM | POA: Diagnosis present
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
## 2021-04-22 MED ORDER — CYCLOBENZAPRINE HCL 10 MG PO TABS
10.0000 mg | ORAL_TABLET | Freq: Once | ORAL | Status: AC
  Administered 2021-04-22: 10 mg via ORAL
  Filled 2021-04-22: qty 1

## 2021-04-22 MED ORDER — CYCLOBENZAPRINE HCL 10 MG PO TABS: 10.0000 mg | ORAL_TABLET | Freq: Three times a day (TID) | ORAL | 0 refills | Status: AC | PRN

## 2021-04-22 MED ORDER — NAPROXEN 375 MG PO TABS: ORAL_TABLET | ORAL | 0 refills | Status: AC

## 2021-04-22 MED ORDER — NAPROXEN 250 MG PO TABS
500.0000 mg | ORAL_TABLET | Freq: Once | ORAL | Status: AC
  Administered 2021-04-22: 500 mg via ORAL
  Filled 2021-04-22: qty 2

## 2021-08-14 ENCOUNTER — Other Ambulatory Visit: Payer: Self-pay

## 2021-08-14 ENCOUNTER — Emergency Department (HOSPITAL_BASED_OUTPATIENT_CLINIC_OR_DEPARTMENT_OTHER)
Admission: EM | Admit: 2021-08-14 | Discharge: 2021-08-14 | Disposition: A | Discharge status: Home / Self Care | Payer: BC Managed Care – PPO | Attending: Emergency Medicine | Admitting: Emergency Medicine

## 2021-08-14 ENCOUNTER — Emergency Department (HOSPITAL_BASED_OUTPATIENT_CLINIC_OR_DEPARTMENT_OTHER): Payer: BC Managed Care – PPO

## 2021-08-14 ENCOUNTER — Encounter (HOSPITAL_BASED_OUTPATIENT_CLINIC_OR_DEPARTMENT_OTHER): Payer: Self-pay

## 2021-08-14 DIAGNOSIS — R0789 Other chest pain: Secondary | ICD-10-CM | POA: Diagnosis not present

## 2021-08-14 DIAGNOSIS — R079 Chest pain, unspecified: Secondary | ICD-10-CM

## 2021-08-14 DIAGNOSIS — I10 Essential (primary) hypertension: Secondary | ICD-10-CM | POA: Insufficient documentation

## 2021-08-14 DIAGNOSIS — Z79899 Other long term (current) drug therapy: Secondary | ICD-10-CM | POA: Insufficient documentation

## 2021-08-14 DIAGNOSIS — R202 Paresthesia of skin: Secondary | ICD-10-CM | POA: Insufficient documentation

## 2021-08-14 LAB — BASIC METABOLIC PANEL
Anion gap: 10 (ref 5–15)
BUN: 10 mg/dL (ref 6–20)
CO2: 23 mmol/L (ref 22–32)
Calcium: 9.4 mg/dL (ref 8.9–10.3)
Chloride: 106 mmol/L (ref 98–111)
Creatinine, Ser: 0.67 mg/dL (ref 0.44–1.00)
GFR, Estimated: >60 mL/min (ref >60)
Glucose, Bld: 93 mg/dL (ref 70–99)
Potassium: 4.5 mmol/L (ref 3.5–5.1)
Sodium: 139 mmol/L (ref 135–145)

## 2021-08-14 LAB — CBC
HCT: 45.3 % (ref 36.0–46.0)
Hemoglobin: 14.1 g/dL (ref 12.0–15.0)
MCH: 26 pg (ref 26.0–34.0)
MCHC: 31.1 g/dL (ref 30.0–36.0)
MCV: 83.4 fL (ref 80.0–100.0)
Platelets: 258 10*3/uL (ref 150–400)
RBC: 5.43 MIL/uL — ABNORMAL HIGH (ref 3.87–5.11)
RDW: 13.3 % (ref 11.5–15.5)
WBC: 7.7 10*3/uL (ref 4.0–10.5)
nRBC: 0 % (ref 0.0–0.2)

## 2021-08-14 LAB — TROPONIN I (HIGH SENSITIVITY): Troponin I (High Sensitivity): 3 ng/L (ref <18)

## 2021-08-14 NOTE — ED Triage Notes (Signed)
Pt arrives to ED with c/o left sided CP X1 week states that pain is going into her upper left arm. Denies NV, SOB, or dizziness.  ?

## 2021-08-14 NOTE — ED Notes (Signed)
Destynee had unsuccessful IV attempt ?

## 2021-08-14 NOTE — ED Provider Notes (Signed)
?[facility] EMERGENCY DEPARTMENT ?Provider Note ? ? ?CSN: [id number] ?Arrival date & time: 08/14/21  1803 ? ?  ? ?History ? ?Chief Complaint  ?Patient presents with  ? Chest Pain  ? ? ?Christina [name] is a 61 y.o. female. ? ?61 yo F with a chief complaints of chest pain.  This is left-sided and last for seconds at a time.  Feels like a pinching and then goes away.  The patient feels gets worse when she lies back flat to go to sleep.  Has been having some tingling in her feet and her left arm and her right hip seems to come and go.  She has been having chest pain for about 7 days.  Denies cough congestion or fever.  Denies trauma.  Denies abdominal pain nausea or vomiting. ? ?She has a history of hypertension, denies diabetes denies hyperlipidemia denies smoking mom had an MI in her 69s. ? ?She denies history of PE or DVT denies hemoptysis denies unilateral lower extremity edema denies recent surgery immobilization or hospitalization.  Denies history of cancer. ? ? ?Chest Pain ? ?  ? ?Home Medications ?Prior to Admission medications   ?Medication Sig Start Date End Date Taking? Authorizing Provider  ?cyclobenzaprine (FLEXERIL) 10 MG tablet Take 1 tablet (10 mg total) by mouth 3 (three) times daily as needed for muscle spasms. 04/22/21   Molpus, John, MD  ?gabapentin (NEURONTIN) 100 MG capsule Take 1 capsule (100 mg total) by mouth 3 (three) times daily. 01/23/19   Myra Rude, MD  ?lisinopril (PRINIVIL,ZESTRIL) 20 MG tablet  04/06/18   [provider]  ?meloxicam (MOBIC) 15 MG tablet Take 15 mg by mouth daily. 01/22/21   [provider]  ?naproxen (NAPROSYN) 375 MG tablet Take 1 tablet twice daily as needed for pain. 04/22/21   Molpus, John, MD  ?omeprazole (PRILOSEC) 40 MG capsule Take 40 mg by mouth daily. 11/06/20   [provider]  ?   ? ?Allergies    ?Buprenorphine hcl, Iodinated contrast media, and Morphine and related   ? ?Review of Systems   ?Review of Systems   ?Cardiovascular:  Positive for chest pain.  ? ?Physical Exam ?Updated Vital Signs ?BP (!) 194/85   Pulse 60   Temp 98.1 ?F (36.7 ?C) (Oral)   Resp 18   Ht 5\' 5"  (1.651 m)   Wt 95.3 kg   SpO2 100%   BMI 34.95 kg/m?  ?Physical Exam ?Vitals and nursing note reviewed.  ?Constitutional:   ?   General: She is not in acute distress. ?   Appearance: She is well-developed. She is not diaphoretic.  ?HENT:  ?   Head: Normocephalic and atraumatic.  ?Eyes:  ?   Pupils: Pupils are equal, round, and reactive to light.  ?Cardiovascular:  ?   Rate and Rhythm: Normal rate and regular rhythm.  ?   Heart sounds: No murmur heard. ?  No friction rub. No gallop.  ?Pulmonary:  ?   Effort: Pulmonary effort is normal.  ?   Breath sounds: No wheezing or rales.  ?Chest:  ?   Chest wall: Tenderness present.  ?   Comments: Pain with palpation of the left anterior chest wall. ?Abdominal:  ?   General: There is no distension.  ?   Palpations: Abdomen is soft.  ?   Tenderness: There is no abdominal tenderness.  ?Musculoskeletal:     ?   General: No tenderness.  ?   Cervical back: Normal range of  motion and neck supple.  ?Skin: ?   General: Skin is warm and dry.  ?Neurological:  ?   Mental Status: She is alert and oriented to person, place, and time.  ?Psychiatric:     ?   Behavior: Behavior normal.  ? ? ?ED Results / Procedures / Treatments   ?Labs ?(all labs ordered are listed, but only abnormal results are displayed) ?Labs Reviewed  ?CBC - Abnormal; Notable for the following components:  ?    Result Value  ? RBC 5.43 (*)   ? All other components within normal limits  ?BASIC METABOLIC PANEL  ?TROPONIN I (HIGH SENSITIVITY)  ?TROPONIN I (HIGH SENSITIVITY)  ? ? ?EKG ?EKG Interpretation ? ?Date/Time:  Saturday August 14 2021 18:09:36 EST ?Ventricular Rate:  63 ?PR Interval:  148 ?QRS Duration: 126 ?QT Interval:  426 ?QTC Calculation: 435 ?R Axis:   73 ?Text Interpretation: Normal sinus rhythm Right bundle branch block Abnormal ECG No  significant change since last tracing Confirmed by Melene Plan [phone]) on 08/14/2021 8:01:16 PM ? ?Radiology ?DG Chest 2 View ? ?Result Date: 08/14/2021 ?CLINICAL DATA:  Left-sided chest pain for 1 week going into the left arm. EXAM: CHEST - 2 VIEW COMPARISON:  07/05/2020 and older exams. FINDINGS: Cardiac silhouette is normal in size and configuration. No mediastinal or hilar masses or evidence of adenopathy. Lungs are clear.  No pleural effusion or pneumothorax. Skeletal structures are intact. IMPRESSION: No active cardiopulmonary disease. Electronically Signed   By: Amie Portland M.D.   On: 08/14/2021 18:32   ? ?Procedures ?Procedures  ? ? ?Medications Ordered in ED ?Medications - No data to display ? ?ED Course/ Medical Decision Making/ A&P ?  ?                        ?Medical Decision Making ?Amount and/or Complexity of Data Reviewed ?Labs: ordered. ?Radiology: ordered. ? ? ?61 yo F with a chief complaint of left-sided chest pain.  This been going on for about a week is atypical in nature and reproduced on exam.  Most likely it is musculoskeletal but she has some components of her history that make it concerning for reflux disease.  She has worsening when she lays back flat and has a sour taste in her mouth after she wakes up in the morning.  She has no significant anemia has no electrolyte abnormalities chest x-ray independently interpreted by me without focal infiltrate or pneumothorax.  EKG without concerning finding. ? ?Troponin negative.  Symptoms going on greater than 3 hours without any significant change.  Feel no delta is warranted.  We will discharge the patient home.  PCP follow-up. ? ?9:56 PM:  I have discussed the diagnosis/risks/treatment options with the patient and family.  Evaluation and diagnostic testing in the emergency department does not suggest an emergent condition requiring admission or immediate intervention beyond what has been performed at this time.  They will follow up with  PCP. We  also discussed returning to the ED immediately if new or worsening sx occur. We discussed the sx which are most concerning (e.g., sudden worsening pain, fever, inability to tolerate by mouth) that necessitate immediate return. Medications administered to the patient during their visit and any new prescriptions provided to the patient are listed below. ? ?Medications given during this visit ?Medications - No data to display ? ? ?The patient appears reasonably screen and/or stabilized for discharge and I doubt any other medical condition or other [facility]  requiring further screening, evaluation, or treatment in the ED at this time prior to discharge.  ? ? ? ? ? ? ? ? ?Final Clinical Impression(s) / ED Diagnoses ?Final diagnoses:  ?Nonspecific chest pain  ? ? ?Rx / DC Orders ?ED Discharge Orders   ? ? None  ? ?  ? ? ?  ?Melene Plan, DO ?08/14/21 2156 ? ?

## 2021-08-14 NOTE — Discharge Instructions (Signed)
Try pepcid or tagamet up to twice a day.  Try to avoid things that may make this worse, most commonly these are spicy foods tomato based products fatty foods chocolate and peppermint.  Alcohol and tobacco can also make this worse.  Return to the emergency department for sudden worsening pain fever or inability to eat or drink.  

## 2021-09-17 ENCOUNTER — Emergency Department (HOSPITAL_BASED_OUTPATIENT_CLINIC_OR_DEPARTMENT_OTHER)
Admission: EM | Admit: 2021-09-17 | Discharge: 2021-09-17 | Disposition: A | Discharge status: Home / Self Care | Payer: BC Managed Care – PPO | Attending: Emergency Medicine | Admitting: Emergency Medicine

## 2021-09-17 ENCOUNTER — Encounter (HOSPITAL_BASED_OUTPATIENT_CLINIC_OR_DEPARTMENT_OTHER): Payer: Self-pay | Admitting: Emergency Medicine

## 2021-09-17 ENCOUNTER — Other Ambulatory Visit: Payer: Self-pay

## 2021-09-17 ENCOUNTER — Emergency Department (HOSPITAL_BASED_OUTPATIENT_CLINIC_OR_DEPARTMENT_OTHER): Payer: BC Managed Care – PPO

## 2021-09-17 DIAGNOSIS — I10 Essential (primary) hypertension: Secondary | ICD-10-CM | POA: Insufficient documentation

## 2021-09-17 DIAGNOSIS — S0512XA Contusion of eyeball and orbital tissues, left eye, initial encounter: Secondary | ICD-10-CM | POA: Diagnosis not present

## 2021-09-17 DIAGNOSIS — M25571 Pain in right ankle and joints of right foot: Secondary | ICD-10-CM | POA: Diagnosis not present

## 2021-09-17 DIAGNOSIS — M25561 Pain in right knee: Secondary | ICD-10-CM | POA: Diagnosis not present

## 2021-09-17 DIAGNOSIS — Y93E1 Activity, personal bathing and showering: Secondary | ICD-10-CM | POA: Diagnosis not present

## 2021-09-17 DIAGNOSIS — M79605 Pain in left leg: Secondary | ICD-10-CM | POA: Insufficient documentation

## 2021-09-17 DIAGNOSIS — M25562 Pain in left knee: Secondary | ICD-10-CM | POA: Diagnosis not present

## 2021-09-17 DIAGNOSIS — S8392XA Sprain of unspecified site of left knee, initial encounter: Secondary | ICD-10-CM

## 2021-09-17 DIAGNOSIS — Z79899 Other long term (current) drug therapy: Secondary | ICD-10-CM | POA: Insufficient documentation

## 2021-09-17 DIAGNOSIS — X501XXA Overexertion from prolonged static or awkward postures, initial encounter: Secondary | ICD-10-CM | POA: Diagnosis not present

## 2021-09-17 DIAGNOSIS — S0592XA Unspecified injury of left eye and orbit, initial encounter: Secondary | ICD-10-CM | POA: Diagnosis present

## 2021-09-17 DIAGNOSIS — M25572 Pain in left ankle and joints of left foot: Secondary | ICD-10-CM | POA: Insufficient documentation

## 2021-09-17 MED ORDER — OXYCODONE-ACETAMINOPHEN 5-325 MG PO TABS
1.0000 | ORAL_TABLET | Freq: Once | ORAL | Status: AC
  Administered 2021-09-17: 1 via ORAL
  Filled 2021-09-17: qty 1

## 2021-09-17 NOTE — ED Triage Notes (Signed)
Pt states she slipped in the shower and since then she has been having pain from her left ankle up to her knee  Pt states she took a pain pill last night without relief  Pt states it hurts to bear weight  ?

## 2021-09-17 NOTE — ED Notes (Signed)
Patients left knee and left ankle were wrapped in an ace bandage. Patient used the walker to ambulate around the room.  ?
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
## 2021-09-27 ENCOUNTER — Other Ambulatory Visit: Payer: Self-pay | Admitting: *Deleted

## 2021-09-27 ENCOUNTER — Telehealth: Payer: Self-pay | Admitting: *Deleted

## 2021-09-27 ENCOUNTER — Ambulatory Visit: Payer: Self-pay

## 2021-09-27 ENCOUNTER — Ambulatory Visit (INDEPENDENT_AMBULATORY_CARE_PROVIDER_SITE_OTHER): Payer: BC Managed Care – PPO | Admitting: Family Medicine

## 2021-09-27 VITALS — BP 138/84 | Ht 65.0 in | Wt 199.0 lb

## 2021-09-27 DIAGNOSIS — T148XXA Other injury of unspecified body region, initial encounter: Secondary | ICD-10-CM | POA: Insufficient documentation

## 2021-09-27 DIAGNOSIS — S060X0A Concussion without loss of consciousness, initial encounter: Secondary | ICD-10-CM | POA: Diagnosis not present

## 2021-09-27 DIAGNOSIS — M79662 Pain in left lower leg: Secondary | ICD-10-CM

## 2021-09-27 MED ORDER — SCOPOLAMINE 1 MG/3DAYS TD PT72: 1.0000 | MEDICATED_PATCH | TRANSDERMAL | Status: DC

## 2021-09-27 MED ORDER — SCOPOLAMINE 1 MG/3DAYS TD PT72: 1.0000 | MEDICATED_PATCH | TRANSDERMAL | 2 refills | Status: AC
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
## 2021-09-27 NOTE — Addendum Note (Signed)
Addended by: Merrilyn Puma on: 09/27/2021 12:08 PM ? ? Modules accepted: Orders ? ?
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
## 2021-09-27 NOTE — Telephone Encounter (Signed)
Opened in error

## 2021-09-27 NOTE — Patient Instructions (Signed)
Good to see you ?Please try the scopolamine as needed for dizziness. ?You can use meloxicam for pain. ?We can also use Tylenol. ?We will try physical therapy for the dizziness. ?Please send me a message in MyChart with any questions or updates.  ?Please see me back in 3 weeks.  ? ?--Dr. Raeford Razor ? ?
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
## 2021-10-04 ENCOUNTER — Ambulatory Visit: Payer: BC Managed Care – PPO | Attending: Family Medicine | Admitting: Physical Therapy

## 2021-10-04 ENCOUNTER — Encounter: Payer: Self-pay | Admitting: Physical Therapy

## 2021-10-04 DIAGNOSIS — M542 Cervicalgia: Secondary | ICD-10-CM | POA: Insufficient documentation

## 2021-10-04 DIAGNOSIS — R42 Dizziness and giddiness: Secondary | ICD-10-CM | POA: Insufficient documentation

## 2021-10-04 DIAGNOSIS — S060X0A Concussion without loss of consciousness, initial encounter: Secondary | ICD-10-CM | POA: Diagnosis not present
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
## 2021-10-05 ENCOUNTER — Other Ambulatory Visit: Payer: Self-pay | Admitting: Family Medicine

## 2021-10-06 ENCOUNTER — Other Ambulatory Visit: Payer: Self-pay | Admitting: Family Medicine

## 2021-10-11 ENCOUNTER — Encounter: Payer: Self-pay | Admitting: Physical Therapy

## 2021-10-11 ENCOUNTER — Ambulatory Visit: Payer: BC Managed Care – PPO | Attending: Family Medicine | Admitting: Physical Therapy

## 2021-10-11 DIAGNOSIS — R42 Dizziness and giddiness: Secondary | ICD-10-CM | POA: Diagnosis present

## 2021-10-11 DIAGNOSIS — M542 Cervicalgia: Secondary | ICD-10-CM | POA: Diagnosis present

## 2021-10-11 DIAGNOSIS — M6281 Muscle weakness (generalized): Secondary | ICD-10-CM | POA: Insufficient documentation

## 2021-10-11 DIAGNOSIS — M25562 Pain in left knee: Secondary | ICD-10-CM | POA: Insufficient documentation

## 2021-10-11 NOTE — Patient Instructions (Signed)
Access Code: [email or web address] ?URL: [email or web address] ?Date: 10/11/2021 ?Prepared by: Harrie Foreman ? ?Exercises ?- Seated Gaze Stabilization with Head Rotation  - 3 x daily - 7 x weekly - 3 sets - 10 reps ?- Seated Gaze Stabilization with Head Nod  - 3 x daily - 7 x weekly - 3 sets - 10 reps ?
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
## 2021-10-13 ENCOUNTER — Encounter: Payer: Self-pay | Admitting: Physical Therapy

## 2021-10-13 ENCOUNTER — Ambulatory Visit: Payer: BC Managed Care – PPO | Admitting: Physical Therapy

## 2021-10-13 DIAGNOSIS — R42 Dizziness and giddiness: Secondary | ICD-10-CM

## 2021-10-13 DIAGNOSIS — M542 Cervicalgia: Secondary | ICD-10-CM
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
## 2021-10-18 ENCOUNTER — Ambulatory Visit (INDEPENDENT_AMBULATORY_CARE_PROVIDER_SITE_OTHER): Payer: BC Managed Care – PPO | Admitting: Family Medicine

## 2021-10-18 ENCOUNTER — Ambulatory Visit: Payer: Self-pay

## 2021-10-18 VITALS — BP 160/80 | Ht 65.0 in | Wt 204.0 lb

## 2021-10-18 DIAGNOSIS — S83242D Other tear of medial meniscus, current injury, left knee, subsequent encounter: Secondary | ICD-10-CM | POA: Insufficient documentation

## 2021-10-18 DIAGNOSIS — M23301 Other meniscus derangements, unspecified lateral meniscus, left knee: Secondary | ICD-10-CM | POA: Diagnosis not present

## 2021-10-18 DIAGNOSIS — S060X0D Concussion without loss of consciousness, subsequent encounter: Secondary | ICD-10-CM | POA: Diagnosis not present

## 2021-10-18 DIAGNOSIS — F4310 Post-traumatic stress disorder, unspecified: Secondary | ICD-10-CM | POA: Insufficient documentation

## 2021-10-18 DIAGNOSIS — M25562 Pain in left knee: Secondary | ICD-10-CM
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
## 2021-10-18 NOTE — Patient Instructions (Signed)
Good to see you ?Please try over-the-counter scopolamine as needed. ?I have made a referral to physical therapy for your left knee. ?We have made a referral to counseling. ?Please send me a message in MyChart with any questions or updates.  ?Please see me back in 4 weeks.  ? ?--Dr. Raeford Razor ? ?
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
## 2021-10-20 ENCOUNTER — Encounter: Payer: Self-pay | Admitting: Physical Therapy

## 2021-10-20 ENCOUNTER — Ambulatory Visit: Payer: BC Managed Care – PPO | Admitting: Physical Therapy

## 2021-10-20 DIAGNOSIS — M25562 Pain in left knee: Secondary | ICD-10-CM

## 2021-10-20 DIAGNOSIS — M542 Cervicalgia: Secondary | ICD-10-CM

## 2021-10-20 DIAGNOSIS — R42 Dizziness and giddiness: Secondary | ICD-10-CM | POA: Diagnosis not present

## 2021-10-20 DIAGNOSIS — M6281 Muscle weakness (generalized): Secondary | ICD-10-CM
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
## 2021-10-21 ENCOUNTER — Encounter: Payer: Self-pay | Admitting: Physical Therapy

## 2021-10-21 ENCOUNTER — Ambulatory Visit: Payer: BC Managed Care – PPO | Admitting: Physical Therapy

## 2021-10-21 DIAGNOSIS — M6281 Muscle weakness (generalized): Secondary | ICD-10-CM

## 2021-10-21 DIAGNOSIS — R42 Dizziness and giddiness: Secondary | ICD-10-CM

## 2021-10-21 DIAGNOSIS — M25562 Pain in left knee: Secondary | ICD-10-CM

## 2021-10-21 DIAGNOSIS — M542 Cervicalgia: Secondary | ICD-10-CM
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
## 2021-10-25 ENCOUNTER — Encounter: Payer: BC Managed Care – PPO | Admitting: Physical Therapy

## 2021-10-27 ENCOUNTER — Encounter: Payer: Self-pay | Admitting: Physical Therapy

## 2021-10-27 ENCOUNTER — Ambulatory Visit: Payer: BC Managed Care – PPO | Admitting: Physical Therapy

## 2021-10-27 DIAGNOSIS — M542 Cervicalgia: Secondary | ICD-10-CM

## 2021-10-27 DIAGNOSIS — M25562 Pain in left knee: Secondary | ICD-10-CM

## 2021-10-27 DIAGNOSIS — R42 Dizziness and giddiness: Secondary | ICD-10-CM | POA: Diagnosis not present

## 2021-10-27 DIAGNOSIS — M6281 Muscle weakness (generalized): Secondary | ICD-10-CM
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
## 2021-10-27 NOTE — Patient Instructions (Signed)

## 2021-11-09 ENCOUNTER — Ambulatory Visit: Payer: BC Managed Care – PPO | Admitting: Physical Therapy

## 2021-11-09 ENCOUNTER — Encounter: Payer: Self-pay | Admitting: Physical Therapy

## 2021-11-09 DIAGNOSIS — M542 Cervicalgia: Secondary | ICD-10-CM

## 2021-11-09 DIAGNOSIS — M25562 Pain in left knee: Secondary | ICD-10-CM

## 2021-11-09 DIAGNOSIS — R42 Dizziness and giddiness: Secondary | ICD-10-CM

## 2021-11-09 DIAGNOSIS — M6281 Muscle weakness (generalized): Secondary | ICD-10-CM
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
## 2021-11-11 ENCOUNTER — Encounter: Payer: BC Managed Care – PPO | Admitting: Physical Therapy

## 2021-11-15 ENCOUNTER — Ambulatory Visit: Payer: BC Managed Care – PPO | Admitting: Family Medicine

## 2021-11-16 ENCOUNTER — Encounter: Payer: Self-pay | Admitting: Family Medicine

## 2021-11-16 ENCOUNTER — Ambulatory Visit (INDEPENDENT_AMBULATORY_CARE_PROVIDER_SITE_OTHER): Payer: BC Managed Care – PPO | Admitting: Family Medicine

## 2021-11-16 VITALS — BP 118/80 | Ht 65.0 in | Wt 203.0 lb

## 2021-11-16 DIAGNOSIS — M23301 Other meniscus derangements, unspecified lateral meniscus, left knee: Secondary | ICD-10-CM

## 2021-11-16 DIAGNOSIS — S060X0D Concussion without loss of consciousness, subsequent encounter: Secondary | ICD-10-CM

## 2021-11-16 DIAGNOSIS — F4310 Post-traumatic stress disorder, unspecified: Secondary | ICD-10-CM | POA: Diagnosis not present

## 2021-11-16 NOTE — Assessment & Plan Note (Signed)
Pain is slowly improving.  Range of motion is doing well. -Counseled on home exercise therapy and supportive care. -Continue physical therapy. -Could consider further imaging.
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
## 2021-11-16 NOTE — Assessment & Plan Note (Signed)
Continues to get improvement.  Still having vertigo type symptoms. -Counseled on home exercise therapy and supportive care. -Continue physical therapy. -May need to consider Antivert.
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
## 2021-11-17 ENCOUNTER — Encounter: Payer: Self-pay | Admitting: *Deleted

## 2021-11-24 ENCOUNTER — Encounter: Payer: Self-pay | Admitting: Physical Therapy

## 2021-11-24 ENCOUNTER — Ambulatory Visit: Payer: BC Managed Care – PPO | Attending: Family Medicine | Admitting: Physical Therapy

## 2021-11-24 DIAGNOSIS — M542 Cervicalgia: Secondary | ICD-10-CM | POA: Diagnosis present

## 2021-11-24 DIAGNOSIS — M6281 Muscle weakness (generalized): Secondary | ICD-10-CM | POA: Insufficient documentation

## 2021-11-24 DIAGNOSIS — R42 Dizziness and giddiness: Secondary | ICD-10-CM | POA: Diagnosis present

## 2021-11-24 DIAGNOSIS — M25562 Pain in left knee: Secondary | ICD-10-CM | POA: Diagnosis present
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
## 2021-11-30 ENCOUNTER — Other Ambulatory Visit: Payer: Self-pay | Admitting: Family Medicine

## 2021-11-30 ENCOUNTER — Ambulatory Visit: Payer: BC Managed Care – PPO | Admitting: Physical Therapy

## 2021-11-30 ENCOUNTER — Encounter: Payer: Self-pay | Admitting: Physical Therapy

## 2021-11-30 DIAGNOSIS — M542 Cervicalgia: Secondary | ICD-10-CM

## 2021-11-30 DIAGNOSIS — M6281 Muscle weakness (generalized): Secondary | ICD-10-CM

## 2021-11-30 DIAGNOSIS — R42 Dizziness and giddiness: Secondary | ICD-10-CM

## 2021-11-30 DIAGNOSIS — M25562 Pain in left knee: Secondary | ICD-10-CM

## 2021-11-30 MED ORDER — MECLIZINE HCL 25 MG PO TABS: 25.0000 mg | ORAL_TABLET | Freq: Three times a day (TID) | ORAL | 3 refills | Status: DC | PRN
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
## 2021-11-30 NOTE — Progress Notes (Signed)
Provided meclizine for dizziness.   Myra Rude, MD [facility] 11/30/2021, 4:39 PM

## 2021-12-07 ENCOUNTER — Other Ambulatory Visit: Payer: Self-pay

## 2021-12-07 ENCOUNTER — Emergency Department (HOSPITAL_BASED_OUTPATIENT_CLINIC_OR_DEPARTMENT_OTHER): Payer: BC Managed Care – PPO

## 2021-12-07 ENCOUNTER — Ambulatory Visit: Payer: BC Managed Care – PPO | Admitting: Physical Therapy

## 2021-12-07 ENCOUNTER — Encounter (HOSPITAL_BASED_OUTPATIENT_CLINIC_OR_DEPARTMENT_OTHER): Payer: Self-pay

## 2021-12-07 ENCOUNTER — Emergency Department (HOSPITAL_BASED_OUTPATIENT_CLINIC_OR_DEPARTMENT_OTHER)
Admission: EM | Admit: 2021-12-07 | Discharge: 2021-12-07 | Disposition: A | Discharge status: Home / Self Care | Payer: BC Managed Care – PPO | Attending: Emergency Medicine | Admitting: Emergency Medicine

## 2021-12-07 DIAGNOSIS — R519 Headache, unspecified: Secondary | ICD-10-CM | POA: Diagnosis not present

## 2021-12-07 DIAGNOSIS — R42 Dizziness and giddiness: Secondary | ICD-10-CM | POA: Insufficient documentation

## 2021-12-07 LAB — CBC
HCT: 42.8 % (ref 36.0–46.0)
Hemoglobin: 13.5 g/dL (ref 12.0–15.0)
MCH: 26.2 pg (ref 26.0–34.0)
MCHC: 31.5 g/dL (ref 30.0–36.0)
MCV: 83.1 fL (ref 80.0–100.0)
Platelets: 268 10*3/uL (ref 150–400)
RBC: 5.15 MIL/uL — ABNORMAL HIGH (ref 3.87–5.11)
RDW: 13.2 % (ref 11.5–15.5)
WBC: 6 10*3/uL (ref 4.0–10.5)
nRBC: 0 % (ref 0.0–0.2)

## 2021-12-07 LAB — BASIC METABOLIC PANEL
Anion gap: 7 (ref 5–15)
BUN: 12 mg/dL (ref 6–20)
CO2: 26 mmol/L (ref 22–32)
Calcium: 9.4 mg/dL (ref 8.9–10.3)
Chloride: 106 mmol/L (ref 98–111)
Creatinine, Ser: 0.75 mg/dL (ref 0.44–1.00)
GFR, Estimated: >60 mL/min (ref >60)
Glucose, Bld: 101 mg/dL — ABNORMAL HIGH (ref 70–99)
Potassium: 3.7 mmol/L (ref 3.5–5.1)
Sodium: 139 mmol/L (ref 135–145)

## 2021-12-07 NOTE — ED Notes (Addendum)
ED Provider at bedside to review results, pt given po crackers and juice per request

## 2021-12-07 NOTE — ED Notes (Signed)
Patient transported to CT 

## 2021-12-27 ENCOUNTER — Encounter: Payer: Self-pay | Admitting: Physical Therapy

## 2021-12-27 ENCOUNTER — Ambulatory Visit: Payer: BC Managed Care – PPO | Attending: Family Medicine | Admitting: Physical Therapy

## 2021-12-27 DIAGNOSIS — M6281 Muscle weakness (generalized): Secondary | ICD-10-CM | POA: Insufficient documentation

## 2021-12-27 DIAGNOSIS — M25562 Pain in left knee: Secondary | ICD-10-CM | POA: Diagnosis not present

## 2021-12-27 DIAGNOSIS — M542 Cervicalgia: Secondary | ICD-10-CM | POA: Diagnosis present

## 2021-12-27 DIAGNOSIS — R42 Dizziness and giddiness: Secondary | ICD-10-CM | POA: Insufficient documentation
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
## 2021-12-27 NOTE — Addendum Note (Signed)
Addended by: Jena Gauss on: 12/27/2021 06:45 PM   Modules accepted: Orders

## 2021-12-30 ENCOUNTER — Ambulatory Visit: Payer: BC Managed Care – PPO | Admitting: Physical Therapy

## 2022-01-03 ENCOUNTER — Ambulatory Visit: Payer: BC Managed Care – PPO | Admitting: Physical Therapy

## 2022-01-03 ENCOUNTER — Encounter: Payer: Self-pay | Admitting: Physical Therapy

## 2022-01-03 DIAGNOSIS — M25562 Pain in left knee: Secondary | ICD-10-CM

## 2022-01-03 DIAGNOSIS — R42 Dizziness and giddiness: Secondary | ICD-10-CM

## 2022-01-03 DIAGNOSIS — M6281 Muscle weakness (generalized): Secondary | ICD-10-CM

## 2022-01-03 DIAGNOSIS — M542 Cervicalgia: Secondary | ICD-10-CM
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
## 2022-01-05 ENCOUNTER — Encounter: Payer: BC Managed Care – PPO | Admitting: Physical Therapy

## 2022-01-10 ENCOUNTER — Ambulatory Visit: Payer: BC Managed Care – PPO | Admitting: Physical Therapy

## 2022-01-10 ENCOUNTER — Encounter: Payer: Self-pay | Admitting: Physical Therapy

## 2022-01-10 DIAGNOSIS — M25562 Pain in left knee: Secondary | ICD-10-CM | POA: Diagnosis not present

## 2022-01-10 DIAGNOSIS — M6281 Muscle weakness (generalized): Secondary | ICD-10-CM

## 2022-01-10 DIAGNOSIS — M542 Cervicalgia: Secondary | ICD-10-CM

## 2022-01-10 DIAGNOSIS — R42 Dizziness and giddiness: Secondary | ICD-10-CM
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
## 2022-01-11 ENCOUNTER — Ambulatory Visit: Payer: BC Managed Care – PPO | Admitting: Physical Therapy

## 2022-01-12 ENCOUNTER — Encounter: Payer: BC Managed Care – PPO | Admitting: Physical Therapy

## 2022-01-17 ENCOUNTER — Ambulatory Visit: Payer: BC Managed Care – PPO | Attending: Family Medicine | Admitting: Physical Therapy

## 2022-01-17 ENCOUNTER — Encounter: Payer: Self-pay | Admitting: Physical Therapy

## 2022-01-17 DIAGNOSIS — R42 Dizziness and giddiness: Secondary | ICD-10-CM | POA: Insufficient documentation

## 2022-01-17 DIAGNOSIS — M25562 Pain in left knee: Secondary | ICD-10-CM | POA: Diagnosis present

## 2022-01-17 DIAGNOSIS — M6281 Muscle weakness (generalized): Secondary | ICD-10-CM | POA: Insufficient documentation

## 2022-01-17 DIAGNOSIS — M542 Cervicalgia: Secondary | ICD-10-CM | POA: Diagnosis present
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
## 2022-01-25 ENCOUNTER — Ambulatory Visit: Payer: BC Managed Care – PPO | Admitting: Physical Therapy

## 2022-02-01 ENCOUNTER — Encounter: Payer: Self-pay | Admitting: Physical Therapy

## 2022-02-01 ENCOUNTER — Ambulatory Visit: Payer: BC Managed Care – PPO | Admitting: Physical Therapy

## 2022-02-01 DIAGNOSIS — M25562 Pain in left knee: Secondary | ICD-10-CM

## 2022-02-01 DIAGNOSIS — M542 Cervicalgia: Secondary | ICD-10-CM

## 2022-02-01 DIAGNOSIS — M6281 Muscle weakness (generalized): Secondary | ICD-10-CM

## 2022-02-01 DIAGNOSIS — R42 Dizziness and giddiness: Secondary | ICD-10-CM
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
## 2022-02-07 ENCOUNTER — Ambulatory Visit: Payer: BC Managed Care – PPO | Admitting: Physical Therapy

## 2022-02-08 ENCOUNTER — Ambulatory Visit (INDEPENDENT_AMBULATORY_CARE_PROVIDER_SITE_OTHER): Payer: BC Managed Care – PPO | Admitting: Family Medicine

## 2022-02-08 ENCOUNTER — Encounter: Payer: Self-pay | Admitting: Family Medicine

## 2022-02-08 VITALS — BP 158/80 | Ht 65.0 in | Wt 202.0 lb

## 2022-02-08 DIAGNOSIS — M23301 Other meniscus derangements, unspecified lateral meniscus, left knee: Secondary | ICD-10-CM | POA: Diagnosis not present

## 2022-02-08 DIAGNOSIS — H8111 Benign paroxysmal vertigo, right ear: Secondary | ICD-10-CM | POA: Diagnosis not present

## 2022-02-08 MED ORDER — HYDROCODONE-ACETAMINOPHEN 5-325 MG PO TABS: 1.0000 | ORAL_TABLET | Freq: Three times a day (TID) | ORAL | 0 refills | Status: AC | PRN

## 2022-02-08 MED ORDER — MECLIZINE HCL 25 MG PO TABS: 25.0000 mg | ORAL_TABLET | Freq: Three times a day (TID) | ORAL | 1 refills | Status: AC | PRN

## 2022-02-08 NOTE — Patient Instructions (Addendum)
Good to see you Please use the pain medicine as needed  We'll get the MRI at [facility] imaging   Please send me a message in MyChart with any questions or updates.  We'll setup a virtual visit once the MRI is resulted.   --Dr. Carlynn Spry bueno verte Utilice el analgsico segn sea necesario. Nos haremos la Secondary school teacher. Por favor enveme un mensaje en MyChart con cualquier pregunta o actualizacin. Programaremos una visita virtual una vez que se obtenga el resultado de la Health visitor.
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
## 2022-02-22 ENCOUNTER — Ambulatory Visit
Admission: RE | Admit: 2022-02-22 | Discharge: 2022-02-22 | Disposition: A | Discharge status: Home / Self Care | Payer: BC Managed Care – PPO | Source: Ambulatory Visit | Attending: Family Medicine | Admitting: Family Medicine

## 2022-02-22 DIAGNOSIS — M23301 Other meniscus derangements, unspecified lateral meniscus, left knee: Secondary | ICD-10-CM

## 2022-02-28 ENCOUNTER — Ambulatory Visit: Payer: BC Managed Care – PPO | Admitting: Family Medicine

## 2022-03-01 ENCOUNTER — Ambulatory Visit (INDEPENDENT_AMBULATORY_CARE_PROVIDER_SITE_OTHER): Payer: BC Managed Care – PPO | Admitting: Family Medicine

## 2022-03-01 ENCOUNTER — Encounter: Payer: Self-pay | Admitting: Family Medicine

## 2022-03-01 VITALS — BP 160/90 | Ht 65.0 in | Wt 202.0 lb

## 2022-03-01 DIAGNOSIS — S83242D Other tear of medial meniscus, current injury, left knee, subsequent encounter: Secondary | ICD-10-CM | POA: Diagnosis not present

## 2022-03-01 NOTE — Patient Instructions (Signed)
Good to see you I have referred you to Dr. Melrose Nakayama   Please send me a message in [place] with any questions or updates.  Please see me back in 6 weeks.   --Dr. Raeford Razor
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
## 2022-04-18 ENCOUNTER — Other Ambulatory Visit: Payer: Self-pay

## 2022-04-18 ENCOUNTER — Emergency Department (HOSPITAL_BASED_OUTPATIENT_CLINIC_OR_DEPARTMENT_OTHER): Payer: BC Managed Care – PPO

## 2022-04-18 ENCOUNTER — Encounter (HOSPITAL_BASED_OUTPATIENT_CLINIC_OR_DEPARTMENT_OTHER): Payer: Self-pay | Admitting: Emergency Medicine

## 2022-04-18 DIAGNOSIS — R519 Headache, unspecified: Secondary | ICD-10-CM | POA: Insufficient documentation

## 2022-04-18 DIAGNOSIS — R197 Diarrhea, unspecified: Secondary | ICD-10-CM | POA: Insufficient documentation

## 2022-04-18 DIAGNOSIS — R0789 Other chest pain: Secondary | ICD-10-CM | POA: Diagnosis not present

## 2022-04-18 DIAGNOSIS — I10 Essential (primary) hypertension: Secondary | ICD-10-CM | POA: Insufficient documentation

## 2022-04-18 DIAGNOSIS — Z20822 Contact with and (suspected) exposure to covid-19: Secondary | ICD-10-CM | POA: Insufficient documentation

## 2022-04-18 DIAGNOSIS — R109 Unspecified abdominal pain: Secondary | ICD-10-CM | POA: Insufficient documentation

## 2022-04-18 DIAGNOSIS — Z79899 Other long term (current) drug therapy: Secondary | ICD-10-CM | POA: Insufficient documentation

## 2022-04-18 LAB — CBC
HCT: 41.6 % (ref 36.0–46.0)
Hemoglobin: 13.3 g/dL (ref 12.0–15.0)
MCH: 26.8 pg (ref 26.0–34.0)
MCHC: 32 g/dL (ref 30.0–36.0)
MCV: 83.7 fL (ref 80.0–100.0)
Platelets: 315 10*3/uL (ref 150–400)
RBC: 4.97 MIL/uL (ref 3.87–5.11)
RDW: 13.3 % (ref 11.5–15.5)
WBC: 9.3 10*3/uL (ref 4.0–10.5)
nRBC: 0 % (ref 0.0–0.2)

## 2022-04-18 LAB — BASIC METABOLIC PANEL
Anion gap: 8 (ref 5–15)
BUN: 16 mg/dL (ref 6–20)
CO2: 25 mmol/L (ref 22–32)
Calcium: 8.7 mg/dL — ABNORMAL LOW (ref 8.9–10.3)
Chloride: 106 mmol/L (ref 98–111)
Creatinine, Ser: 0.74 mg/dL (ref 0.44–1.00)
GFR, Estimated: >60 mL/min (ref >60)
Glucose, Bld: 122 mg/dL — ABNORMAL HIGH (ref 70–99)
Potassium: 3.5 mmol/L (ref 3.5–5.1)
Sodium: 139 mmol/L (ref 135–145)

## 2022-04-18 LAB — RESP PANEL BY RT-PCR (FLU A&B, COVID) ARPGX2
Influenza A by PCR: NEGATIVE
Influenza B by PCR: NEGATIVE
SARS Coronavirus 2 by RT PCR: NEGATIVE

## 2022-04-18 LAB — TROPONIN I (HIGH SENSITIVITY): Troponin I (High Sensitivity): 4 ng/L (ref <18)
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
## 2022-04-19 ENCOUNTER — Emergency Department (HOSPITAL_BASED_OUTPATIENT_CLINIC_OR_DEPARTMENT_OTHER)
Admission: EM | Admit: 2022-04-19 | Discharge: 2022-04-19 | Disposition: A | Discharge status: Home / Self Care | Payer: BC Managed Care – PPO | Attending: Emergency Medicine | Admitting: Emergency Medicine

## 2022-04-19 DIAGNOSIS — R0789 Other chest pain: Secondary | ICD-10-CM

## 2022-04-19 DIAGNOSIS — R519 Headache, unspecified: Secondary | ICD-10-CM

## 2022-04-19 DIAGNOSIS — R197 Diarrhea, unspecified: Secondary | ICD-10-CM

## 2022-04-19 NOTE — Discharge Instructions (Signed)
Continue medications as previously prescribed.  Follow-up with your primary doctor/neurologist in the next week if not improving.
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
## 2022-04-19 NOTE — ED Notes (Signed)
Pt denies any pain at this time.  Unable to identify any needs.  She states her "slight chest pain is gone."

## 2022-09-26 ENCOUNTER — Encounter: Payer: Self-pay | Admitting: *Deleted

## 2022-09-27 ENCOUNTER — Encounter: Payer: Self-pay | Admitting: Physical Therapy

## 2022-09-27 ENCOUNTER — Ambulatory Visit: Payer: BC Managed Care – PPO | Attending: Neurology | Admitting: Physical Therapy

## 2022-09-27 DIAGNOSIS — R42 Dizziness and giddiness: Secondary | ICD-10-CM | POA: Diagnosis present

## 2022-09-27 DIAGNOSIS — M542 Cervicalgia: Secondary | ICD-10-CM | POA: Diagnosis present
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
## 2022-09-29 ENCOUNTER — Ambulatory Visit: Payer: BC Managed Care – PPO | Admitting: Physical Therapy

## 2022-10-13 ENCOUNTER — Ambulatory Visit: Payer: BC Managed Care – PPO | Admitting: Physical Therapy

## 2022-10-18 ENCOUNTER — Ambulatory Visit: Payer: BC Managed Care – PPO | Attending: Neurology | Admitting: Physical Therapy

## 2022-10-18 ENCOUNTER — Encounter: Payer: Self-pay | Admitting: Physical Therapy

## 2022-10-18 DIAGNOSIS — M542 Cervicalgia: Secondary | ICD-10-CM

## 2022-10-18 DIAGNOSIS — R42 Dizziness and giddiness: Secondary | ICD-10-CM
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
## 2022-10-20 ENCOUNTER — Encounter: Payer: BC Managed Care – PPO | Admitting: Physical Therapy

## 2022-10-25 ENCOUNTER — Ambulatory Visit: Payer: BC Managed Care – PPO | Admitting: Physical Therapy

## 2022-11-01 ENCOUNTER — Ambulatory Visit: Payer: BC Managed Care – PPO | Admitting: Physical Therapy

## 2022-11-01 ENCOUNTER — Encounter: Payer: Self-pay | Admitting: Physical Therapy

## 2022-11-01 DIAGNOSIS — R42 Dizziness and giddiness: Secondary | ICD-10-CM

## 2022-11-01 DIAGNOSIS — M542 Cervicalgia: Secondary | ICD-10-CM
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
## 2022-11-08 ENCOUNTER — Ambulatory Visit: Payer: BC Managed Care – PPO | Admitting: Physical Therapy

## 2022-11-08 ENCOUNTER — Encounter: Payer: Self-pay | Admitting: Physical Therapy

## 2022-11-08 DIAGNOSIS — M542 Cervicalgia: Secondary | ICD-10-CM

## 2022-11-08 DIAGNOSIS — R42 Dizziness and giddiness: Secondary | ICD-10-CM | POA: Diagnosis not present
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
## 2022-11-14 ENCOUNTER — Ambulatory Visit: Payer: BC Managed Care – PPO | Admitting: Physical Therapy

## 2022-11-21 ENCOUNTER — Ambulatory Visit: Payer: BC Managed Care – PPO | Attending: Neurology | Admitting: Physical Therapy

## 2022-11-21 ENCOUNTER — Encounter: Payer: Self-pay | Admitting: Physical Therapy

## 2022-11-21 DIAGNOSIS — R42 Dizziness and giddiness: Secondary | ICD-10-CM | POA: Diagnosis present

## 2022-11-21 DIAGNOSIS — M542 Cervicalgia: Secondary | ICD-10-CM | POA: Diagnosis present
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
## 2022-11-28 ENCOUNTER — Ambulatory Visit: Payer: BC Managed Care – PPO | Admitting: Physical Therapy

## 2022-11-28 ENCOUNTER — Encounter: Payer: Self-pay | Admitting: Physical Therapy

## 2022-11-28 DIAGNOSIS — R42 Dizziness and giddiness: Secondary | ICD-10-CM | POA: Diagnosis not present

## 2022-11-28 DIAGNOSIS — M542 Cervicalgia: Secondary | ICD-10-CM
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
## 2022-12-06 ENCOUNTER — Ambulatory Visit: Payer: BC Managed Care – PPO | Admitting: Physical Therapy

## 2022-12-20 ENCOUNTER — Ambulatory Visit: Payer: BC Managed Care – PPO | Attending: Neurology

## 2022-12-20 DIAGNOSIS — R42 Dizziness and giddiness: Secondary | ICD-10-CM | POA: Diagnosis present

## 2022-12-20 DIAGNOSIS — M542 Cervicalgia: Secondary | ICD-10-CM

## 2022-12-20 DIAGNOSIS — M25562 Pain in left knee: Secondary | ICD-10-CM

## 2022-12-20 DIAGNOSIS — M6281 Muscle weakness (generalized): Secondary | ICD-10-CM | POA: Diagnosis present
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
## 2022-12-21 ENCOUNTER — Other Ambulatory Visit: Payer: Self-pay

## 2023-01-03 ENCOUNTER — Ambulatory Visit: Payer: BC Managed Care – PPO | Admitting: Physical Therapy

## 2023-01-03 ENCOUNTER — Encounter: Payer: Self-pay | Admitting: Physical Therapy

## 2023-01-03 DIAGNOSIS — R42 Dizziness and giddiness: Secondary | ICD-10-CM

## 2023-01-03 DIAGNOSIS — M542 Cervicalgia: Secondary | ICD-10-CM
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
## 2023-01-18 ENCOUNTER — Encounter: Payer: BC Managed Care – PPO | Admitting: Physical Therapy

## 2023-01-24 ENCOUNTER — Ambulatory Visit: Payer: BC Managed Care – PPO | Admitting: Physical Therapy

## 2023-01-31 ENCOUNTER — Ambulatory Visit: Payer: BC Managed Care – PPO | Admitting: Physical Therapy

## 2023-02-15 ENCOUNTER — Ambulatory Visit: Payer: BC Managed Care – PPO | Attending: Neurology | Admitting: Physical Therapy

## 2023-02-15 ENCOUNTER — Encounter: Payer: Self-pay | Admitting: Physical Therapy

## 2023-02-15 DIAGNOSIS — R42 Dizziness and giddiness: Secondary | ICD-10-CM | POA: Diagnosis present

## 2023-02-15 DIAGNOSIS — M542 Cervicalgia: Secondary | ICD-10-CM | POA: Diagnosis present
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
## 2023-02-22 ENCOUNTER — Ambulatory Visit: Payer: BC Managed Care – PPO | Admitting: Physical Therapy

## 2023-02-28 ENCOUNTER — Other Ambulatory Visit: Payer: Self-pay

## 2023-02-28 ENCOUNTER — Emergency Department (HOSPITAL_BASED_OUTPATIENT_CLINIC_OR_DEPARTMENT_OTHER): Payer: BC Managed Care – PPO

## 2023-02-28 ENCOUNTER — Other Ambulatory Visit (HOSPITAL_BASED_OUTPATIENT_CLINIC_OR_DEPARTMENT_OTHER): Payer: Self-pay

## 2023-02-28 ENCOUNTER — Emergency Department (HOSPITAL_BASED_OUTPATIENT_CLINIC_OR_DEPARTMENT_OTHER)
Admission: EM | Admit: 2023-02-28 | Discharge: 2023-02-28 | Disposition: A | Discharge status: Home / Self Care | Payer: BC Managed Care – PPO | Attending: Emergency Medicine | Admitting: Emergency Medicine

## 2023-02-28 ENCOUNTER — Encounter (HOSPITAL_BASED_OUTPATIENT_CLINIC_OR_DEPARTMENT_OTHER): Payer: Self-pay

## 2023-02-28 DIAGNOSIS — R519 Headache, unspecified: Secondary | ICD-10-CM | POA: Insufficient documentation

## 2023-02-28 DIAGNOSIS — R42 Dizziness and giddiness: Secondary | ICD-10-CM | POA: Diagnosis not present

## 2023-02-28 DIAGNOSIS — R55 Syncope and collapse: Secondary | ICD-10-CM

## 2023-02-28 DIAGNOSIS — G8929 Other chronic pain: Secondary | ICD-10-CM | POA: Diagnosis not present

## 2023-02-28 LAB — CBC
HCT: 40.7 % (ref 36.0–46.0)
Hemoglobin: 12.7 g/dL (ref 12.0–15.0)
MCH: 26 pg (ref 26.0–34.0)
MCHC: 31.2 g/dL (ref 30.0–36.0)
MCV: 83.2 fL (ref 80.0–100.0)
Platelets: 296 10*3/uL (ref 150–400)
RBC: 4.89 MIL/uL (ref 3.87–5.11)
RDW: 13.2 % (ref 11.5–15.5)
WBC: 10.4 10*3/uL (ref 4.0–10.5)
nRBC: 0 % (ref 0.0–0.2)

## 2023-02-28 LAB — BASIC METABOLIC PANEL
Anion gap: 10 (ref 5–15)
BUN: 20 mg/dL (ref 6–20)
CO2: 23 mmol/L (ref 22–32)
Calcium: 8.9 mg/dL (ref 8.9–10.3)
Chloride: 105 mmol/L (ref 98–111)
Creatinine, Ser: 0.97 mg/dL (ref 0.44–1.00)
GFR, Estimated: >60 mL/min (ref >60)
Glucose, Bld: 130 mg/dL — ABNORMAL HIGH (ref 70–99)
Potassium: 4.1 mmol/L (ref 3.5–5.1)
Sodium: 138 mmol/L (ref 135–145)

## 2023-02-28 LAB — HEPATIC FUNCTION PANEL
ALT: 17 U/L (ref 0–44)
AST: 25 U/L (ref 15–41)
Albumin: 4.1 g/dL (ref 3.5–5.0)
Alkaline Phosphatase: 93 U/L (ref 38–126)
Bilirubin, Direct: 0.3 mg/dL — ABNORMAL HIGH (ref 0.0–0.2)
Indirect Bilirubin: 0.1 mg/dL — ABNORMAL LOW (ref 0.3–0.9)
Total Bilirubin: 0.4 mg/dL (ref 0.3–1.2)
Total Protein: 7.6 g/dL (ref 6.5–8.1)

## 2023-02-28 LAB — LIPASE, BLOOD: Lipase: 45 U/L (ref 11–51)

## 2023-02-28 MED ORDER — MECLIZINE HCL 25 MG PO TABS
25.0000 mg | ORAL_TABLET | Freq: Three times a day (TID) | ORAL | 0 refills | Status: AC | PRN
  Filled 2023-02-28: qty 30, 10d supply, fill #0

## 2023-02-28 MED ORDER — ONDANSETRON HCL 4 MG/2ML IJ SOLN
4.0000 mg | Freq: Once | INTRAMUSCULAR | Status: AC | PRN
  Administered 2023-02-28: 4 mg via INTRAVENOUS
  Filled 2023-02-28: qty 2
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
## 2023-02-28 NOTE — Discharge Instructions (Signed)
Keep your appointment with neurology for this afternoon.  Since most of the symptoms are chronic following up with them will be very helpful.  Your meclizine/Antivert medication has been renewed and can pick it up at the pharmacy here.  Today's workup without any significant findings which is reassuring.  But does not give you answers for the symptoms you have had for the past year.
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
## 2023-03-01 ENCOUNTER — Encounter: Payer: Self-pay | Admitting: Physical Therapy

## 2023-03-01 ENCOUNTER — Ambulatory Visit: Payer: BC Managed Care – PPO | Admitting: Physical Therapy

## 2023-03-01 DIAGNOSIS — R42 Dizziness and giddiness: Secondary | ICD-10-CM | POA: Diagnosis not present

## 2023-03-01 DIAGNOSIS — M542 Cervicalgia: Secondary | ICD-10-CM
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
## 2023-03-03 ENCOUNTER — Emergency Department (HOSPITAL_COMMUNITY): Payer: BC Managed Care – PPO

## 2023-03-03 ENCOUNTER — Emergency Department (HOSPITAL_COMMUNITY)
Admission: EM | Admit: 2023-03-03 | Discharge: 2023-03-04 | Disposition: A | Discharge status: Home / Self Care | Payer: BC Managed Care – PPO | Attending: Emergency Medicine | Admitting: Emergency Medicine

## 2023-03-03 ENCOUNTER — Other Ambulatory Visit: Payer: Self-pay

## 2023-03-03 DIAGNOSIS — Z79899 Other long term (current) drug therapy: Secondary | ICD-10-CM | POA: Insufficient documentation

## 2023-03-03 DIAGNOSIS — R42 Dizziness and giddiness: Secondary | ICD-10-CM

## 2023-03-03 DIAGNOSIS — I10 Essential (primary) hypertension: Secondary | ICD-10-CM | POA: Diagnosis not present

## 2023-03-03 LAB — CBC
HCT: 42.5 % (ref 36.0–46.0)
Hemoglobin: 13.2 g/dL (ref 12.0–15.0)
MCH: 26.3 pg (ref 26.0–34.0)
MCHC: 31.1 g/dL (ref 30.0–36.0)
MCV: 84.8 fL (ref 80.0–100.0)
Platelets: 297 10*3/uL (ref 150–400)
RBC: 5.01 MIL/uL (ref 3.87–5.11)
RDW: 13.2 % (ref 11.5–15.5)
WBC: 6.5 10*3/uL (ref 4.0–10.5)
nRBC: 0 % (ref 0.0–0.2)

## 2023-03-03 LAB — COMPREHENSIVE METABOLIC PANEL
ALT: 21 U/L (ref 0–44)
AST: 21 U/L (ref 15–41)
Albumin: 4.1 g/dL (ref 3.5–5.0)
Alkaline Phosphatase: 95 U/L (ref 38–126)
Anion gap: 9 (ref 5–15)
BUN: 16 mg/dL (ref 6–20)
CO2: 24 mmol/L (ref 22–32)
Calcium: 8.9 mg/dL (ref 8.9–10.3)
Chloride: 104 mmol/L (ref 98–111)
Creatinine, Ser: 0.67 mg/dL (ref 0.44–1.00)
GFR, Estimated: >60 mL/min (ref >60)
Glucose, Bld: 103 mg/dL — ABNORMAL HIGH (ref 70–99)
Potassium: 3.9 mmol/L (ref 3.5–5.1)
Sodium: 137 mmol/L (ref 135–145)
Total Bilirubin: 0.9 mg/dL (ref 0.3–1.2)
Total Protein: 7.4 g/dL (ref 6.5–8.1)

## 2023-03-03 MED ORDER — GADOBUTROL 1 MMOL/ML IV SOLN
10.0000 mL | Freq: Once | INTRAVENOUS | Status: AC | PRN
  Administered 2023-03-03: 10 mL via INTRAVENOUS

## 2023-03-03 MED ORDER — DIPHENHYDRAMINE HCL 50 MG/ML IJ SOLN: 50.0000 mg | Freq: Once | INTRAMUSCULAR | Status: DC

## 2023-03-03 MED ORDER — DIPHENHYDRAMINE HCL 25 MG PO CAPS: 50.0000 mg | ORAL_CAPSULE | Freq: Once | ORAL | Status: DC

## 2023-03-03 MED ORDER — DIAZEPAM 5 MG/ML IJ SOLN
2.5000 mg | Freq: Once | INTRAMUSCULAR | Status: AC
  Administered 2023-03-03: 2.5 mg via INTRAVENOUS
  Filled 2023-03-03: qty 2

## 2023-03-03 MED ORDER — IBUPROFEN 200 MG PO TABS
600.0000 mg | ORAL_TABLET | Freq: Once | ORAL | Status: AC
  Administered 2023-03-04: 600 mg via ORAL
  Filled 2023-03-03: qty 3

## 2023-03-03 MED ORDER — METHYLPREDNISOLONE SODIUM SUCC 40 MG IJ SOLR: 40.0000 mg | Freq: Once | INTRAMUSCULAR | Status: DC

## 2023-03-03 NOTE — ED Notes (Signed)
Pt transported to MRI via w/c.

## 2023-03-03 NOTE — Discharge Instructions (Addendum)
It was a pleasure caring for you today. You will need to follow up with your Neurologist. Please also take your medication as prescribed. Seek emergency care if experiencing any new or worsening symptoms.
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
## 2023-03-03 NOTE — ED Triage Notes (Signed)
Pt arrived via POV/ C/o worsening vertigo. Was seen at [facility] recently for same. Pt has had trouble sleeping d/t vertigo.  AOx4

## 2023-03-08 ENCOUNTER — Encounter: Payer: BC Managed Care – PPO | Admitting: Physical Therapy

## 2023-03-15 ENCOUNTER — Encounter: Payer: Self-pay | Admitting: Physical Therapy

## 2023-03-15 ENCOUNTER — Ambulatory Visit: Payer: BC Managed Care – PPO | Attending: Neurology | Admitting: Physical Therapy

## 2023-03-15 DIAGNOSIS — M542 Cervicalgia: Secondary | ICD-10-CM | POA: Diagnosis present

## 2023-03-15 DIAGNOSIS — R42 Dizziness and giddiness: Secondary | ICD-10-CM | POA: Insufficient documentation
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
## 2023-03-22 ENCOUNTER — Encounter: Payer: Self-pay | Admitting: Physical Therapy

## 2023-03-22 ENCOUNTER — Ambulatory Visit: Payer: BC Managed Care – PPO | Admitting: Physical Therapy

## 2023-03-22 DIAGNOSIS — R42 Dizziness and giddiness: Secondary | ICD-10-CM

## 2023-03-22 DIAGNOSIS — M542 Cervicalgia: Secondary | ICD-10-CM
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
## 2023-03-29 ENCOUNTER — Encounter: Payer: Self-pay | Admitting: Physical Therapy

## 2023-03-29 ENCOUNTER — Ambulatory Visit: Payer: BC Managed Care – PPO | Admitting: Physical Therapy

## 2023-03-29 DIAGNOSIS — M542 Cervicalgia: Secondary | ICD-10-CM

## 2023-03-29 DIAGNOSIS — R42 Dizziness and giddiness: Secondary | ICD-10-CM
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
## 2023-04-05 ENCOUNTER — Encounter: Payer: Self-pay | Admitting: Physical Therapy

## 2023-04-05 ENCOUNTER — Ambulatory Visit: Payer: BC Managed Care – PPO | Admitting: Physical Therapy

## 2023-04-05 DIAGNOSIS — R42 Dizziness and giddiness: Secondary | ICD-10-CM

## 2023-04-05 DIAGNOSIS — M542 Cervicalgia: Secondary | ICD-10-CM
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
## 2023-04-12 ENCOUNTER — Ambulatory Visit: Payer: BC Managed Care – PPO | Admitting: Physical Therapy

## 2023-04-12 ENCOUNTER — Encounter: Payer: Self-pay | Admitting: Physical Therapy

## 2023-04-12 DIAGNOSIS — M542 Cervicalgia: Secondary | ICD-10-CM

## 2023-04-12 DIAGNOSIS — R42 Dizziness and giddiness: Secondary | ICD-10-CM
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
## 2023-06-08 ENCOUNTER — Ambulatory Visit
Admission: EM | Admit: 2023-06-08 | Discharge: 2023-06-08 | Disposition: A | Discharge status: Home / Self Care | Payer: BC Managed Care – PPO | Attending: Family Medicine | Admitting: Family Medicine

## 2023-06-08 DIAGNOSIS — B349 Viral infection, unspecified: Secondary | ICD-10-CM | POA: Diagnosis not present

## 2023-06-08 DIAGNOSIS — I10 Essential (primary) hypertension: Secondary | ICD-10-CM

## 2023-06-08 DIAGNOSIS — L209 Atopic dermatitis, unspecified: Secondary | ICD-10-CM

## 2023-06-08 MED ORDER — OSELTAMIVIR PHOSPHATE 75 MG PO CAPS: 75.0000 mg | ORAL_CAPSULE | Freq: Two times a day (BID) | ORAL | 0 refills | Status: DC

## 2023-06-08 MED ORDER — CETIRIZINE HCL 10 MG PO TABS: 10.0000 mg | ORAL_TABLET | Freq: Every day | ORAL | 0 refills | Status: AC

## 2023-06-08 MED ORDER — ACETAMINOPHEN 325 MG PO TABS
650.0000 mg | ORAL_TABLET | Freq: Four times a day (QID) | ORAL | 0 refills | Status: AC | PRN
Start: 2023-06-08 — End: ?

## 2023-06-08 MED ORDER — PROMETHAZINE-DM 6.25-15 MG/5ML PO SYRP
5.0000 mL | ORAL_SOLUTION | Freq: Three times a day (TID) | ORAL | 0 refills | Status: AC | PRN
Start: 2023-06-08 — End: ?

## 2023-06-08 MED ORDER — TRIAMCINOLONE ACETONIDE 0.1 % EX CREA: 1.0000 | TOPICAL_CREAM | Freq: Two times a day (BID) | CUTANEOUS | 0 refills | Status: AC

## 2023-06-08 NOTE — ED Provider Notes (Signed)
[facility]  Note:  This document was prepared using Conservation officer, historic buildings and may include unintentional dictation errors.  MRN: [id number] DOB: 07/10/1960  Subjective:   Christina [name] is a 62 y.o. female presenting for 2 day history of acute onset body aches, fever, coughing, malaise and fatigue. No overt chest pain, shob, wheezing. No smoking of any kind including cigarettes, cigars, vaping, marijuana use.  No history of asthma. Would also like a cream for recurrent rash, itching, dry scaly skin over lower legs bilaterally.   No current facility-administered medications for this encounter.  Current Outpatient Medications:    Chlorphen-Pseudoephed-APAP (THERAFLU FLU/COLD PO), Take by mouth., Disp: , Rfl:    Pseudoeph-Doxylamine-DM-APAP (NYQUIL PO), Take by mouth., Disp: , Rfl:    cyclobenzaprine (FLEXERIL) 10 MG tablet, Take 1 tablet (10 mg total) by mouth 3 (three) times daily as needed for muscle spasms. (Patient not taking: Reported on 09/27/2022), Disp: 20 tablet, Rfl: 0   gabapentin (NEURONTIN) 100 MG capsule, Take 1 capsule (100 mg total) by mouth 3 (three) times daily. (Patient not taking: Reported on 09/27/2022), Disp: 90 capsule, Rfl: 1   HYDROcodone-acetaminophen (NORCO/VICODIN) 5-325 MG tablet, Take 1 tablet by mouth every 8 (eight) hours as needed. (Patient not taking: Reported on 09/27/2022), Disp: 15 tablet, Rfl: 0   lisinopril (PRINIVIL,ZESTRIL) 20 MG tablet, , Disp: , Rfl: 0   meclizine (ANTIVERT) 25 MG tablet, Take 1 tablet (25 mg total) by mouth 3 (three) times daily as needed for dizziness., Disp: 30 tablet, Rfl: 1   meclizine (ANTIVERT) 25 MG tablet, Take 1 tablet (25 mg total) by mouth 3 (three) times daily as needed for dizziness., Disp: 30 tablet, Rfl: 0   meloxicam (MOBIC) 15 MG tablet, Take 15 mg by mouth daily. (Patient not taking: Reported on 09/27/2022), Disp: , Rfl:    naproxen (NAPROSYN) 375 MG tablet, Take 1 tablet twice daily  as needed for pain. (Patient not taking: Reported on 09/27/2022), Disp: 20 tablet, Rfl: 0   nortriptyline (PAMELOR) 25 MG capsule, Take 25 mg by mouth at bedtime., Disp: , Rfl:    omeprazole (PRILOSEC) 40 MG capsule, Take 40 mg by mouth daily. (Patient not taking: Reported on 09/27/2022), Disp: , Rfl:    rizatriptan (MAXALT) 10 MG tablet, Take 10 mg by mouth as needed for migraine. May repeat in 2 hours if needed, Disp: , Rfl:    scopolamine (TRANSDERM-SCOP) 1 MG/3DAYS, Place 1 patch (1.5 mg total) onto the skin every 3 (three) days. (Patient not taking: Reported on 09/27/2022), Disp: 10 patch, Rfl: 2   Allergies  Allergen Reactions   Morphine Itching    Other reaction(s): Itching   Buprenorphine Itching   Buprenorphine Hcl Itching    Other reaction(s): Itching, Other reaction(s): Itching, Other reaction(s): Itching   Iodinated Contrast Media Rash    Other reaction(s): Rash  Other reaction(s): Rash  Other reaction(s): Rash  Other reaction(s): Rash, Other reaction(s): Rash   Morphine And Codeine Itching    Other reaction(s): Itching     Past Medical History:  Diagnosis Date   GERD (gastroesophageal reflux disease)    Hypertension      Past Surgical History:  Procedure Laterality Date   CESAREAN SECTION     CHOLECYSTECTOMY      Family History  Problem Relation Age of Onset   CAD Father    Hypertension Father    Diabetes Mother    Hypertension Mother    Diabetes Sister  Hypertension Sister    Diabetes Brother    Hypertension Brother    Hyperlipidemia Neg Hx     Social History   Tobacco Use   Smoking status: Never   Smokeless tobacco: Never  Vaping Use   Vaping status: Never Used  Substance Use Topics   Alcohol use: Not Currently    Alcohol/week: 0.0 standard drinks of alcohol   Drug use: No    ROS   Objective:   Vitals: BP (!) 190/84 (BP Location: Right Arm)   Pulse 89   Temp 99.3 F (37.4 C) (Oral)   Resp 20   SpO2 95%   Physical  Exam Constitutional:      General: She is not in acute distress.    Appearance: Normal appearance. She is well-developed and normal weight. She is not ill-appearing, toxic-appearing or diaphoretic.  HENT:     Head: Normocephalic and atraumatic.     Right Ear: Tympanic membrane, ear canal and external ear normal. No drainage or tenderness. No middle ear effusion. There is no impacted cerumen. Tympanic membrane is not erythematous or bulging.     Left Ear: Tympanic membrane, ear canal and external ear normal. No drainage or tenderness.  No middle ear effusion. There is no impacted cerumen. Tympanic membrane is not erythematous or bulging.     Nose: Nose normal. No congestion or rhinorrhea.     Mouth/Throat:     Mouth: Mucous membranes are moist. No oral lesions.     Pharynx: No pharyngeal swelling, oropharyngeal exudate, posterior oropharyngeal erythema or uvula swelling.     Tonsils: No tonsillar exudate or tonsillar abscesses.  Eyes:     General: No scleral icterus.       Right eye: No discharge.        Left eye: No discharge.     Extraocular Movements: Extraocular movements intact.     Right eye: Normal extraocular motion.     Left eye: Normal extraocular motion.     Conjunctiva/sclera: Conjunctivae normal.  Cardiovascular:     Rate and Rhythm: Normal rate and regular rhythm.     Heart sounds: Normal heart sounds. No murmur heard.    No friction rub. No gallop.  Pulmonary:     Effort: Pulmonary effort is normal. No respiratory distress.     Breath sounds: No stridor. No wheezing, rhonchi or rales.  Chest:     Chest wall: No tenderness.  Musculoskeletal:     Cervical back: Normal range of motion and neck supple.  Lymphadenopathy:     Cervical: No cervical adenopathy.  Skin:    General: Skin is warm and dry.     Findings: Rash (dry scaly patches of varying sizes over anterior lateral aspect of lower legs bilaterally) present.  Neurological:     General: No focal deficit present.      Mental Status: She is alert and oriented to person, place, and time.  Psychiatric:        Mood and Affect: Mood normal.        Behavior: Behavior normal.    Assessment and Plan :   PDMP not reviewed this encounter.  1. Acute viral syndrome   2. Essential hypertension   3. Atopic dermatitis, unspecified type    Patient politely declined POC COVID/flu testing due to significant fear of a nasal swab. Will cover for influenza with Tamiflu given symptom set, current incidence in the community.  Use supportive care, rest, fluids, hydration, light meals, schedule Tylenol. Start triamcinolone cream  for suspected atopic dermatitis. Deferred imaging given clear cardiopulmonary exam, hemodynamically stable vital signs.  Counseled patient on potential for adverse effects with medications prescribed today, patient verbalized understanding. ER and return-to-clinic precautions discussed, patient verbalized understanding.    Wallis Bamberg, [place] 06/08/23 1907

## 2023-06-08 NOTE — Discharge Instructions (Addendum)
We will manage this as a viral syndrome like influenza with Tamiflu. For sore throat or cough try using a honey-based tea. Use 3 teaspoons of honey with juice squeezed from half lemon. Place shaved pieces of ginger into 1/2-1 cup of water and warm over stove top. Then mix the ingredients and repeat every 4 hours as needed. Please take Tylenol 500mg -650mg  once every 6 hours for fevers, aches and pains. Hydrate very well with at least 2 liters (80 ounces) of water. Eat light meals such as soups (chicken and noodles, chicken wild rice, vegetable).  Do not eat any foods that you are allergic to.  Start an antihistamine like Zyrtec (10mg  daily) for postnasal drainage, sinus congestion.  You can take this together with cough syrup.

## 2023-06-08 NOTE — ED Triage Notes (Signed)
Pt reports cough, fever, body aches x 2 days. Theraflu and Nyquil gives no relief.   Pt report itching in lower in leg x 1 week. States this happens every year.
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
## 2023-08-12 ENCOUNTER — Other Ambulatory Visit: Payer: Self-pay

## 2023-08-12 ENCOUNTER — Encounter (HOSPITAL_BASED_OUTPATIENT_CLINIC_OR_DEPARTMENT_OTHER): Payer: Self-pay | Admitting: Emergency Medicine

## 2023-08-12 ENCOUNTER — Emergency Department (HOSPITAL_BASED_OUTPATIENT_CLINIC_OR_DEPARTMENT_OTHER)
Admission: EM | Admit: 2023-08-12 | Discharge: 2023-08-12 | Disposition: A | Discharge status: Home / Self Care | Attending: Emergency Medicine | Admitting: Emergency Medicine

## 2023-08-12 DIAGNOSIS — I160 Hypertensive urgency: Secondary | ICD-10-CM | POA: Diagnosis not present

## 2023-08-12 DIAGNOSIS — J029 Acute pharyngitis, unspecified: Secondary | ICD-10-CM | POA: Diagnosis present

## 2023-08-12 DIAGNOSIS — Z79899 Other long term (current) drug therapy: Secondary | ICD-10-CM | POA: Insufficient documentation

## 2023-08-12 DIAGNOSIS — J069 Acute upper respiratory infection, unspecified: Secondary | ICD-10-CM | POA: Diagnosis not present

## 2023-08-12 DIAGNOSIS — I10 Essential (primary) hypertension: Secondary | ICD-10-CM | POA: Diagnosis not present

## 2023-08-12 LAB — RESP PANEL BY RT-PCR (RSV, FLU A&B, COVID)  RVPGX2
Influenza A by PCR: NEGATIVE
Influenza B by PCR: NEGATIVE
Resp Syncytial Virus by PCR: NEGATIVE
SARS Coronavirus 2 by RT PCR: NEGATIVE

## 2023-08-12 MED ORDER — LIDOCAINE VISCOUS HCL 2 % MT SOLN
15.0000 mL | Freq: Once | OROMUCOSAL | Status: AC
  Administered 2023-08-12: 15 mL via OROMUCOSAL
  Filled 2023-08-12: qty 15

## 2023-08-12 MED ORDER — LIDOCAINE VISCOUS HCL 2 % MT SOLN: 15.0000 mL | OROMUCOSAL | 0 refills | Status: AC | PRN

## 2023-08-12 MED ORDER — BENZONATATE 100 MG PO CAPS: 100.0000 mg | ORAL_CAPSULE | Freq: Three times a day (TID) | ORAL | 0 refills | Status: AC | PRN

## 2023-08-12 MED ORDER — ACETAMINOPHEN 500 MG PO TABS
1000.0000 mg | ORAL_TABLET | Freq: Once | ORAL | Status: AC
  Administered 2023-08-12: 1000 mg via ORAL
  Filled 2023-08-12: qty 2

## 2023-08-12 NOTE — ED Notes (Signed)
 Pt unable to tolerate strep swab after two attempts. Requests to be evaluated by provider before another attempt.

## 2023-08-12 NOTE — ED Notes (Signed)
 Refused strep swab, EDP aware

## 2023-08-12 NOTE — ED Triage Notes (Signed)
 Pt reports cough and sore throat that started 4 days ago. No fevers. No chest pain. No SOB.   Notable Triage BP 197/98 - pt reports taking her BP medication PTA.
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
## 2023-08-12 NOTE — Discharge Instructions (Addendum)
 Be sure to drink increased fluids while you are sick.  Take Tylenol and/or ibuprofen to help with fever or pain.  We are giving you a medicine to take to help numb the back of your throat for your sore throat.  Your blood pressure is quite elevated today, make sure you take your medicines as prescribed, limit salt intake, increase exercise, and follow-up closely with your primary care doctor.  If you develop new or worsening pain in your throat, inability to swallow, neck swelling, coughing up blood, trouble breathing, or any other new/concerning symptoms then return to the ER or call 911.  Asegrese de beber ms lquidos mientras est enfermo.  Tome Tylenol y/o ibuprofeno para ayudar con la fiebre o Chief Technology Officer.  Le estamos dando un medicamento para ayudar a Office manager parte posterior de la garganta para el dolor de Advertising copywriter.  Su presin arterial est bastante elevada hoy, asegrese de tomar sus medicamentos segn lo prescrito, limitar el consumo de sal, aumentar el ejercicio y Education officer, environmental un seguimiento estrecho con su mdico de atencin primaria.  Si presenta dolor de garganta nuevo o que empeora, incapacidad para tragar, hinchazn del cuello, tos con sangre, dificultad para respirar o cualquier otro sntoma nuevo o preocupante, regrese a la sala de emergencias o llame al 911.

## 2023-12-19 ENCOUNTER — Emergency Department (HOSPITAL_COMMUNITY): Admission: EM | Admit: 2023-12-19 | Discharge: 2023-12-19 | Disposition: A | Discharge status: Home / Self Care

## 2023-12-19 ENCOUNTER — Other Ambulatory Visit: Payer: Self-pay

## 2023-12-19 ENCOUNTER — Encounter (HOSPITAL_COMMUNITY): Payer: Self-pay

## 2023-12-19 DIAGNOSIS — Z79899 Other long term (current) drug therapy: Secondary | ICD-10-CM | POA: Diagnosis not present

## 2023-12-19 DIAGNOSIS — F419 Anxiety disorder, unspecified: Secondary | ICD-10-CM | POA: Diagnosis not present

## 2023-12-19 DIAGNOSIS — I10 Essential (primary) hypertension: Secondary | ICD-10-CM | POA: Diagnosis not present

## 2023-12-19 DIAGNOSIS — R0789 Other chest pain: Secondary | ICD-10-CM | POA: Diagnosis present

## 2023-12-19 LAB — CBC WITH DIFFERENTIAL/PLATELET
Abs Immature Granulocytes: 0.02 K/uL (ref 0.00–0.07)
Basophils Absolute: 0.1 K/uL (ref 0.0–0.1)
Basophils Relative: 1 %
Eosinophils Absolute: 0.1 K/uL (ref 0.0–0.5)
Eosinophils Relative: 1 %
HCT: 41.9 % (ref 36.0–46.0)
Hemoglobin: 13 g/dL (ref 12.0–15.0)
Immature Granulocytes: 0 %
Lymphocytes Relative: 53 %
Lymphs Abs: 4.6 K/uL — ABNORMAL HIGH (ref 0.7–4.0)
MCH: 26.2 pg (ref 26.0–34.0)
MCHC: 31 g/dL (ref 30.0–36.0)
MCV: 84.3 fL (ref 80.0–100.0)
Monocytes Absolute: 0.7 K/uL (ref 0.1–1.0)
Monocytes Relative: 8 %
Neutro Abs: 3.2 K/uL (ref 1.7–7.7)
Neutrophils Relative %: 37 %
Platelets: 270 K/uL (ref 150–400)
RBC: 4.97 MIL/uL (ref 3.87–5.11)
RDW: 13.2 % (ref 11.5–15.5)
WBC: 8.6 K/uL (ref 4.0–10.5)
nRBC: 0 % (ref 0.0–0.2)

## 2023-12-19 LAB — BASIC METABOLIC PANEL WITH GFR
Anion gap: 12 (ref 5–15)
BUN: 14 mg/dL (ref 6–20)
CO2: 23 mmol/L (ref 22–32)
Calcium: 9.4 mg/dL (ref 8.9–10.3)
Chloride: 105 mmol/L (ref 98–111)
Creatinine, Ser: 0.78 mg/dL (ref 0.44–1.00)
GFR, Estimated: >60 mL/min (ref >60)
Glucose, Bld: 116 mg/dL — ABNORMAL HIGH (ref 70–99)
Potassium: 3.8 mmol/L (ref 3.5–5.1)
Sodium: 140 mmol/L (ref 135–145)

## 2023-12-19 LAB — TROPONIN I (HIGH SENSITIVITY): Troponin I (High Sensitivity): 6 ng/L (ref <18)

## 2023-12-19 MED ORDER — HYDROXYZINE HCL 25 MG PO TABS
25.0000 mg | ORAL_TABLET | Freq: Once | ORAL | Status: AC
  Administered 2023-12-19: 25 mg via ORAL
  Filled 2023-12-19: qty 1

## 2023-12-19 MED ORDER — HYDROXYZINE HCL 25 MG PO TABS: 25.0000 mg | ORAL_TABLET | Freq: Three times a day (TID) | ORAL | 0 refills | Status: DC | PRN

## 2023-12-19 MED ORDER — HYDROXYZINE HCL 25 MG PO TABS: 25.0000 mg | ORAL_TABLET | Freq: Three times a day (TID) | ORAL | 0 refills | Status: AC | PRN

## 2023-12-19 NOTE — ED Triage Notes (Signed)
 Pt states lost relative recently and is now having anxiety, shaking and cannot sleep, also states she feels palpitations.

## 2023-12-19 NOTE — Discharge Instructions (Addendum)
 You can take your Vistaril  as needed for anxiety.  Follow-up with your primary care doctor.  Stay on your other medications as prescribed and keep an eye on your blood pressure.  Return to the ER for any new or worsening symptoms
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
## 2024-02-04 ENCOUNTER — Emergency Department (HOSPITAL_COMMUNITY)
Admission: EM | Admit: 2024-02-04 | Discharge: 2024-02-04 | Disposition: A | Discharge status: Home / Self Care | Attending: Emergency Medicine | Admitting: Emergency Medicine

## 2024-02-04 ENCOUNTER — Encounter (HOSPITAL_COMMUNITY): Payer: Self-pay

## 2024-02-04 ENCOUNTER — Other Ambulatory Visit: Payer: Self-pay

## 2024-02-04 DIAGNOSIS — R42 Dizziness and giddiness: Secondary | ICD-10-CM | POA: Diagnosis not present

## 2024-02-04 DIAGNOSIS — R002 Palpitations: Secondary | ICD-10-CM | POA: Diagnosis present

## 2024-02-04 DIAGNOSIS — F419 Anxiety disorder, unspecified: Secondary | ICD-10-CM | POA: Diagnosis not present

## 2024-02-04 LAB — URINALYSIS, ROUTINE W REFLEX MICROSCOPIC
Bilirubin Urine: NEGATIVE
Glucose, UA: NEGATIVE mg/dL
Ketones, ur: NEGATIVE mg/dL
Leukocytes,Ua: NEGATIVE
Nitrite: NEGATIVE
Protein, ur: NEGATIVE mg/dL
Specific Gravity, Urine: 1.011 (ref 1.005–1.030)
pH: 6 (ref 5.0–8.0)

## 2024-02-04 LAB — CBC WITH DIFFERENTIAL/PLATELET
Abs Immature Granulocytes: 0.02 K/uL (ref 0.00–0.07)
Basophils Absolute: 0.1 K/uL (ref 0.0–0.1)
Basophils Relative: 1 %
Eosinophils Absolute: 0.1 K/uL (ref 0.0–0.5)
Eosinophils Relative: 1 %
HCT: 42.7 % (ref 36.0–46.0)
Hemoglobin: 12.9 g/dL (ref 12.0–15.0)
Immature Granulocytes: 0 %
Lymphocytes Relative: 47 %
Lymphs Abs: 3.1 K/uL (ref 0.7–4.0)
MCH: 25.8 pg — ABNORMAL LOW (ref 26.0–34.0)
MCHC: 30.2 g/dL (ref 30.0–36.0)
MCV: 85.4 fL (ref 80.0–100.0)
Monocytes Absolute: 0.5 K/uL (ref 0.1–1.0)
Monocytes Relative: 8 %
Neutro Abs: 2.8 K/uL (ref 1.7–7.7)
Neutrophils Relative %: 43 %
Platelets: 260 K/uL (ref 150–400)
RBC: 5 MIL/uL (ref 3.87–5.11)
RDW: 13.2 % (ref 11.5–15.5)
WBC: 6.6 K/uL (ref 4.0–10.5)
nRBC: 0 % (ref 0.0–0.2)

## 2024-02-04 LAB — COMPREHENSIVE METABOLIC PANEL WITH GFR
ALT: 17 U/L (ref 0–44)
AST: 21 U/L (ref 15–41)
Albumin: 3.9 g/dL (ref 3.5–5.0)
Alkaline Phosphatase: 101 U/L (ref 38–126)
Anion gap: 9 (ref 5–15)
BUN: 18 mg/dL (ref 6–20)
CO2: 23 mmol/L (ref 22–32)
Calcium: 9.3 mg/dL (ref 8.9–10.3)
Chloride: 107 mmol/L (ref 98–111)
Creatinine, Ser: 0.72 mg/dL (ref 0.44–1.00)
GFR, Estimated: >60 mL/min (ref >60)
Glucose, Bld: 103 mg/dL — ABNORMAL HIGH (ref 70–99)
Potassium: 4.3 mmol/L (ref 3.5–5.1)
Sodium: 139 mmol/L (ref 135–145)
Total Bilirubin: 1 mg/dL (ref 0.0–1.2)
Total Protein: 7.3 g/dL (ref 6.5–8.1)

## 2024-02-04 MED ORDER — HYDROXYZINE HCL 25 MG PO TABS: 25.0000 mg | ORAL_TABLET | Freq: Four times a day (QID) | ORAL | 0 refills | Status: AC

## 2024-02-04 NOTE — Discharge Instructions (Signed)
 As we discussed, please follow-up with your primary care regarding management of your PTSD/anxiety as well as your blood pressure.  Regarding your vertigo, I encourage you to follow-up with your neurologist regarding ongoing managing of this.
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
## 2024-02-04 NOTE — ED Provider Notes (Signed)
 [place] EMERGENCY DEPARTMENT AT [facility] Provider Note   CSN: [id number] Arrival date & time: 02/04/24  1038     Patient presents with: Anxiety   Christina [name] is a 63 y.o. female who is primary concern in coming to the ED today was that Christina [name] felt palpitations as well as is having insomnia.  Christina [name] is having episodes in middle of night where Christina [name] wakes up screaming, has prior history of PTSD as well as anxiety, further medical diagnoses of hypertension.  Christina [name] also states Christina [name] has an difficult to describe sensation to the forehead but denies this is not having any dizziness or vertigo, but feels like Christina [name] has a jelly sensation to the top of her head.  Again denies any chest pain, headache, shortness of breath.  Seen in this ED on 8 July for similar symptoms, prescribed hydroxyzine  for same.    Anxiety       Prior to Admission medications   Medication Sig Start Date End Date Taking? Authorizing Provider  hydrOXYzine  (ATARAX ) 25 MG tablet Take 1 tablet (25 mg total) by mouth every 6 (six) hours. 02/04/24  Yes Myriam Dorn BROCKS, PA  acetaminophen  (TYLENOL ) 325 MG tablet Take 2 tablets (650 mg total) by mouth every 6 (six) hours as needed for moderate pain (pain score 4-6). 06/08/23   Christopher Savannah, PA-C  benzonatate  (TESSALON ) 100 MG capsule Take 1 capsule (100 mg total) by mouth 3 (three) times daily as needed for cough. 08/12/23   Freddi Hamilton, MD  cetirizine  (ZYRTEC  ALLERGY) 10 MG tablet Take 1 tablet (10 mg total) by mouth daily. 06/08/23   Christopher Savannah, PA-C  Chlorphen-Pseudoephed-APAP [facility] FLU/COLD PO) Take by mouth.    [provider]  cyclobenzaprine  (FLEXERIL ) 10 MG tablet Take 1 tablet (10 mg total) by mouth 3 (three) times daily as needed for muscle spasms. Patient not taking: Reported on 09/27/2022 04/22/21   Molpus, Norleen, MD  gabapentin  (NEURONTIN ) 100 MG capsule Take 1 capsule (100 mg total) by mouth 3 (three) times daily. Patient not taking: Reported  on 09/27/2022 01/23/19   Chick Venetia BRAVO, MD  HYDROcodone -acetaminophen  (NORCO/VICODIN) 5-325 MG tablet Take 1 tablet by mouth every 8 (eight) hours as needed. Patient not taking: Reported on 09/27/2022 02/08/22   Chick Venetia BRAVO, MD  lidocaine  (XYLOCAINE ) 2 % solution Use as directed 15 mLs in the mouth or throat every 4 (four) hours as needed for mouth pain. 08/12/23   Freddi Hamilton, MD  lisinopril (PRINIVIL,ZESTRIL) 20 MG tablet  04/06/18   [provider]  meclizine  (ANTIVERT ) 25 MG tablet Take 1 tablet (25 mg total) by mouth 3 (three) times daily as needed for dizziness. 02/08/22   Chick Venetia BRAVO, MD  meclizine  (ANTIVERT ) 25 MG tablet Take 1 tablet (25 mg total) by mouth 3 (three) times daily as needed for dizziness. 02/28/23   Zackowski, Scott, MD  meloxicam (MOBIC) 15 MG tablet Take 15 mg by mouth daily. Patient not taking: Reported on 09/27/2022 01/22/21   [provider]  naproxen  (NAPROSYN ) 375 MG tablet Take 1 tablet twice daily as needed for pain. Patient not taking: Reported on 09/27/2022 04/22/21   Molpus, John, MD  nortriptyline (PAMELOR) 25 MG capsule Take 25 mg by mouth at bedtime.    [provider]  omeprazole  (PRILOSEC) 40 MG capsule Take 40 mg by mouth daily. Patient not taking: Reported on 09/27/2022 11/06/20   [provider]  oseltamivir  (TAMIFLU ) 75 MG capsule Take 1 capsule (75 mg total)  by mouth 2 (two) times daily. 06/08/23   Christopher Savannah, PA-C  promethazine -dextromethorphan (PROMETHAZINE -DM) 6.25-15 MG/5ML syrup Take 5 mLs by mouth 3 (three) times daily as needed for cough. 06/08/23   Christopher Savannah, PA-C  Pseudoeph-Doxylamine-DM-APAP (NYQUIL PO) Take by mouth.    [provider]  rizatriptan (MAXALT) 10 MG tablet Take 10 mg by mouth as needed for migraine. May repeat in 2 hours if needed    [provider]  scopolamine  (TRANSDERM-SCOP) 1 MG/3DAYS Place 1 patch (1.5 mg total) onto the skin every 3 (three) days. Patient not  taking: Reported on 09/27/2022 09/27/21   Chick Venetia BRAVO, MD  triamcinolone  cream (KENALOG ) 0.1 % Apply 1 Application topically 2 (two) times daily. 06/08/23   Christopher Savannah, PA-C    Allergies: Morphine , Buprenorphine, Buprenorphine hcl, Iodinated contrast media, and Morphine  and codeine    Review of Systems  Cardiovascular:  Positive for palpitations.  All other systems reviewed and are negative.   Updated Vital Signs BP (!) 148/83 (BP Location: Right Arm)   Pulse 63   Temp (!) 97.3 F (36.3 C) (Oral)   Resp 14   SpO2 100%   Physical Exam Vitals and nursing note reviewed.  Constitutional:      General: Christina [name] is not in acute distress.    Appearance: Normal appearance.  HENT:     Head: Normocephalic and atraumatic.     Mouth/Throat:     Mouth: Mucous membranes are moist.     Pharynx: Oropharynx is clear.  Eyes:     Extraocular Movements: Extraocular movements intact.     Conjunctiva/sclera: Conjunctivae normal.     Pupils: Pupils are equal, round, and reactive to light.  Cardiovascular:     Rate and Rhythm: Normal rate and regular rhythm.     Pulses: Normal pulses.     Heart sounds: Normal heart sounds. No murmur heard.    No friction rub. No gallop.  Pulmonary:     Effort: Pulmonary effort is normal.     Breath sounds: Normal breath sounds.  Abdominal:     General: Abdomen is flat. Bowel sounds are normal.     Palpations: Abdomen is soft.  Musculoskeletal:        General: Normal range of motion.     Cervical back: Normal range of motion and neck supple.     Right lower leg: No edema.     Left lower leg: No edema.  Skin:    General: Skin is warm and dry.     Capillary Refill: Capillary refill takes less than 2 seconds.  Neurological:     General: No focal deficit present.     Mental Status: Christina [name] is alert and oriented to person, place, and time. Mental status is at baseline.     GCS: GCS eye subscore is 4. GCS verbal subscore is 5. GCS motor subscore is 6.      Cranial Nerves: Cranial nerves 2-12 are intact.     Sensory: Sensation is intact.     Motor: Motor function is intact.     Coordination: Coordination is intact.  Psychiatric:        Mood and Affect: Mood normal.     (all labs ordered are listed, but only abnormal results are displayed) Labs Reviewed  COMPREHENSIVE METABOLIC PANEL WITH GFR - Abnormal; Notable for the following components:      Result Value   Glucose, Bld 103 (*)    All other components within normal limits  CBC WITH  DIFFERENTIAL/PLATELET - Abnormal; Notable for the following components:   MCH 25.8 (*)    All other components within normal limits  URINALYSIS, ROUTINE W REFLEX MICROSCOPIC - Abnormal; Notable for the following components:   Color, Urine STRAW (*)    Hgb urine dipstick SMALL (*)    Bacteria, UA RARE (*)    All other components within normal limits    EKG: None  Radiology: No results found.   Procedures   Medications Ordered in the ED - No data to display                                  Medical Decision Making Amount and/or Complexity of Data Reviewed Labs: ordered.   Medical Decision Making:   Breshay Ilg is a 63 y.o. female who presented to the ED today with palpitations and insomnia detailed above.    External chart has been reviewed including previous labs, imaging, family practice records as well as neurology record. Patient's presentation is complicated by their history of PTSD.  Patient placed on continuous vitals and telemetry monitoring while in ED which was reviewed periodically.  Complete initial physical exam performed, notably the patient  was alert and oriented in no apparent distress with unremarkable physical exam.    Reviewed and confirmed nursing documentation for past medical history, family history, social history.    Initial Assessment:   With the patient's presentation of palpitations and insomnia, most likely diagnosis is anxiety/PTSD.  Consider acute  dysrhythmia, ACS, electrolyte abnormality.  Initial Plan:   Screening labs including CBC and Metabolic panel to evaluate for infectious or metabolic etiology of disease.  Urinalysis with reflex culture ordered to evaluate for UTI or relevant urologic/nephrologic pathology.   EKG to evaluate for cardiac pathology Objective evaluation as below reviewed   Initial Study Results:   Laboratory  All laboratory results reviewed without evidence of clinically relevant pathology.   Exceptions include: None  EKG EKG was reviewed independently. Rate, rhythm, axis, intervals all examined and without medically relevant abnormality. ST segments without concerns for elevations.    Reassessment and Plan:   Lab evaluation does not show any remarkable abnormalities and physical exam is unremarkable.  As such, believe that the symptoms are consistent with continued sequelae of PTSD/anxiety.  Christina [name] has insomnia as well as palpitations which did not occur during her stay here during continuous cardiac monitoring EKG is unremarkable.       Final diagnoses:  Anxiety  Vertigo    ED Discharge Orders          Ordered    hydrOXYzine  (ATARAX ) 25 MG tablet  Every 6 hours        02/04/24 1534               Myriam Dorn BROCKS, [place] 02/04/24 1535    Charlyn Sora, MD 02/06/24 [phone]

## 2024-06-12 ENCOUNTER — Other Ambulatory Visit: Payer: Self-pay

## 2024-06-12 ENCOUNTER — Encounter (HOSPITAL_BASED_OUTPATIENT_CLINIC_OR_DEPARTMENT_OTHER): Payer: Self-pay

## 2024-06-12 ENCOUNTER — Emergency Department (HOSPITAL_BASED_OUTPATIENT_CLINIC_OR_DEPARTMENT_OTHER)
Admission: EM | Admit: 2024-06-12 | Discharge: 2024-06-12 | Disposition: A | Discharge status: Home / Self Care | Attending: Emergency Medicine | Admitting: Emergency Medicine

## 2024-06-12 DIAGNOSIS — R509 Fever, unspecified: Secondary | ICD-10-CM | POA: Diagnosis present

## 2024-06-12 DIAGNOSIS — J111 Influenza due to unidentified influenza virus with other respiratory manifestations: Secondary | ICD-10-CM | POA: Insufficient documentation

## 2024-06-12 LAB — BASIC METABOLIC PANEL WITH GFR
Anion gap: 16 — ABNORMAL HIGH (ref 5–15)
BUN: 13 mg/dL (ref 8–23)
CO2: 21 mmol/L — ABNORMAL LOW (ref 22–32)
Calcium: 9.8 mg/dL (ref 8.9–10.3)
Chloride: 102 mmol/L (ref 98–111)
Creatinine, Ser: 0.74 mg/dL (ref 0.44–1.00)
GFR, Estimated: >60 mL/min
Glucose, Bld: 101 mg/dL — ABNORMAL HIGH (ref 70–99)
Potassium: 3.9 mmol/L (ref 3.5–5.1)
Sodium: 138 mmol/L (ref 135–145)

## 2024-06-12 LAB — CBC WITH DIFFERENTIAL/PLATELET
Abs Immature Granulocytes: 0.03 K/uL (ref 0.00–0.07)
Basophils Absolute: 0.1 K/uL (ref 0.0–0.1)
Basophils Relative: 1 %
Eosinophils Absolute: 0.1 K/uL (ref 0.0–0.5)
Eosinophils Relative: 1 %
HCT: 41.7 % (ref 36.0–46.0)
Hemoglobin: 13.3 g/dL (ref 12.0–15.0)
Immature Granulocytes: 0 %
Lymphocytes Relative: 10 %
Lymphs Abs: 0.9 K/uL (ref 0.7–4.0)
MCH: 26 pg (ref 26.0–34.0)
MCHC: 31.9 g/dL (ref 30.0–36.0)
MCV: 81.4 fL (ref 80.0–100.0)
Monocytes Absolute: 0.5 K/uL (ref 0.1–1.0)
Monocytes Relative: 6 %
Neutro Abs: 7.6 K/uL (ref 1.7–7.7)
Neutrophils Relative %: 82 %
Platelets: 211 K/uL (ref 150–400)
RBC: 5.12 MIL/uL — ABNORMAL HIGH (ref 3.87–5.11)
RDW: 13.4 % (ref 11.5–15.5)
WBC: 9.1 K/uL (ref 4.0–10.5)
nRBC: 0 % (ref 0.0–0.2)

## 2024-06-12 MED ORDER — OSELTAMIVIR PHOSPHATE 75 MG PO CAPS: 75.0000 mg | ORAL_CAPSULE | Freq: Two times a day (BID) | ORAL | 0 refills | Status: AC

## 2024-06-12 MED ORDER — ACETAMINOPHEN 325 MG PO TABS
650.0000 mg | ORAL_TABLET | Freq: Once | ORAL | Status: AC | PRN
  Administered 2024-06-12: 650 mg via ORAL
  Filled 2024-06-12: qty 2

## 2024-06-12 MED ORDER — IBUPROFEN 400 MG PO TABS
400.0000 mg | ORAL_TABLET | Freq: Once | ORAL | Status: AC
  Administered 2024-06-12: 400 mg via ORAL
  Filled 2024-06-12: qty 1

## 2024-06-12 NOTE — ED Notes (Signed)
 The patient reports she is still feeling lightheaded. Dr. Mannie informed.

## 2024-06-12 NOTE — ED Notes (Signed)
 ..  The patient is A&OX4, ambulatory at d/c with independent steady gait, NAD. Pt verbalized understanding of d/c instructions, prescription and follow up care.

## 2024-06-12 NOTE — ED Triage Notes (Signed)
 Headache, chills, cough, congestion, fatigue, palpitations since last night.   States the palpitations have calmed down

## 2024-06-12 NOTE — ED Provider Notes (Signed)
 " [place] EMERGENCY DEPARTMENT AT [facility] Provider Note   CSN: [id number] Arrival date & time: 06/12/24  1217     Patient presents with: Cough and Palpitations   Christina [name] is a 63 y.o. female.   63 year old female here today for fever, myalgias, cough palpitations.  Symptoms began yesterday.   Cough Palpitations Associated symptoms: cough        Prior to Admission medications  Medication Sig Start Date End Date Taking? Authorizing Provider  mirtazapine (REMERON) 7.5 MG tablet Take 7.5 mg by mouth at bedtime. 05/23/24 06/22/24 Yes [provider]  oseltamivir  (TAMIFLU ) 75 MG capsule Take 1 capsule (75 mg total) by mouth every 12 (twelve) hours. 06/12/24  Yes Mannie Pac T, DO  acetaminophen  (TYLENOL ) 325 MG tablet Take 2 tablets (650 mg total) by mouth every 6 (six) hours as needed for moderate pain (pain score 4-6). 06/08/23   Christopher Savannah, PA-C  benzonatate  (TESSALON ) 100 MG capsule Take 1 capsule (100 mg total) by mouth 3 (three) times daily as needed for cough. 08/12/23   Freddi Hamilton, MD  cetirizine  (ZYRTEC  ALLERGY) 10 MG tablet Take 1 tablet (10 mg total) by mouth daily. 06/08/23   Christopher Savannah, PA-C  Chlorphen-Pseudoephed-APAP [facility] FLU/COLD PO) Take by mouth.    [provider]  cyclobenzaprine  (FLEXERIL ) 10 MG tablet Take 1 tablet (10 mg total) by mouth 3 (three) times daily as needed for muscle spasms. Patient not taking: Reported on 09/27/2022 04/22/21   Molpus, Norleen, MD  gabapentin  (NEURONTIN ) 100 MG capsule Take 1 capsule (100 mg total) by mouth 3 (three) times daily. Patient not taking: Reported on 09/27/2022 01/23/19   Chick Venetia BRAVO, MD  HYDROcodone -acetaminophen  (NORCO/VICODIN) 5-325 MG tablet Take 1 tablet by mouth every 8 (eight) hours as needed. Patient not taking: Reported on 09/27/2022 02/08/22   Chick Venetia BRAVO, MD  hydrOXYzine  (ATARAX ) 25 MG tablet Take 1 tablet (25 mg total) by mouth every 6 (six) hours.  02/04/24   Myriam Dorn BROCKS, PA  lidocaine  (XYLOCAINE ) 2 % solution Use as directed 15 mLs in the mouth or throat every 4 (four) hours as needed for mouth pain. 08/12/23   Freddi Hamilton, MD  lisinopril (PRINIVIL,ZESTRIL) 20 MG tablet  04/06/18   [provider]  meclizine  (ANTIVERT ) 25 MG tablet Take 1 tablet (25 mg total) by mouth 3 (three) times daily as needed for dizziness. 02/08/22   Chick Venetia BRAVO, MD  meclizine  (ANTIVERT ) 25 MG tablet Take 1 tablet (25 mg total) by mouth 3 (three) times daily as needed for dizziness. 02/28/23   Zackowski, Scott, MD  meloxicam (MOBIC) 15 MG tablet Take 15 mg by mouth daily. Patient not taking: Reported on 09/27/2022 01/22/21   [provider]  naproxen  (NAPROSYN ) 375 MG tablet Take 1 tablet twice daily as needed for pain. Patient not taking: Reported on 09/27/2022 04/22/21   Molpus, John, MD  nortriptyline (PAMELOR) 25 MG capsule Take 25 mg by mouth at bedtime.    [provider]  omeprazole  (PRILOSEC) 40 MG capsule Take 40 mg by mouth daily. Patient not taking: Reported on 09/27/2022 11/06/20   [provider]  promethazine -dextromethorphan (PROMETHAZINE -DM) 6.25-15 MG/5ML syrup Take 5 mLs by mouth 3 (three) times daily as needed for cough. 06/08/23   Christopher Savannah, PA-C  Pseudoeph-Doxylamine-DM-APAP (NYQUIL PO) Take by mouth.    [provider]  rizatriptan (MAXALT) 10 MG tablet Take 10 mg by mouth as needed for migraine. May repeat in 2 hours if  needed    [provider]  scopolamine  (TRANSDERM-SCOP) 1 MG/3DAYS Place 1 patch (1.5 mg total) onto the skin every 3 (three) days. Patient not taking: Reported on 09/27/2022 09/27/21   Chick Venetia BRAVO, MD  triamcinolone  cream (KENALOG ) 0.1 % Apply 1 Application topically 2 (two) times daily. 06/08/23   Christopher Savannah, PA-C    Allergies: Morphine , Buprenorphine, Buprenorphine hcl, Iodinated contrast media, and Morphine  and codeine    Review of Systems   Respiratory:  Positive for cough.   Cardiovascular:  Positive for palpitations.    Updated Vital Signs BP (!) 155/69 (BP Location: Right Arm)   Pulse 84   Temp (!) 100.8 F (38.2 C) (Oral)   Resp 17   SpO2 96%   Physical Exam Vitals reviewed.  Constitutional:      Appearance: She is not toxic-appearing.  HENT:     Head: Normocephalic and atraumatic.  Eyes:     Pupils: Pupils are equal, round, and reactive to light.  Cardiovascular:     Rate and Rhythm: Normal rate.  Pulmonary:     Effort: Pulmonary effort is normal.     Breath sounds: Normal breath sounds.  Abdominal:     General: Abdomen is flat.     Palpations: Abdomen is soft.  Musculoskeletal:        General: Normal range of motion.  Neurological:     General: No focal deficit present.     Mental Status: She is alert.     (all labs ordered are listed, but only abnormal results are displayed) Labs Reviewed  BASIC METABOLIC PANEL WITH GFR - Abnormal; Notable for the following components:      Result Value   CO2 21 (*)    Glucose, Bld 101 (*)    Anion gap 16 (*)    All other components within normal limits  CBC WITH DIFFERENTIAL/PLATELET - Abnormal; Notable for the following components:   RBC 5.12 (*)    All other components within normal limits    EKG: EKG Interpretation Date/Time:  Wednesday June 12 2024 12:38:55 EST Ventricular Rate:  92 PR Interval:  136 QRS Duration:  127 QT Interval:  373 QTC Calculation: 462 R Axis:   35  Text Interpretation: Sinus rhythm Right bundle branch block Confirmed by Mannie Pac [phone]) on 06/12/2024 3:40:03 PM  Radiology: No results found.   Procedures   Medications Ordered in the ED  ibuprofen  (ADVIL ) tablet 400 mg (has no administration in time range)  acetaminophen  (TYLENOL ) tablet 650 mg (650 mg Oral Given 06/12/24 1237)                                    Medical Decision Making 63 year old female here today for URI symptoms,  palpitations.  Differential diagnoses include influenza, palpitations, likely ACS.  Plan -patient's symptoms are consistent with viral influenza.  Her husband is in the room and has similar symptoms.  Unfortunately we do not have enough viral swabs, but clinically the patient has influenza.  Basic blood work done shows normal renal function no leukocytosis.  My independent review the patient's EKG shows a right bundle branch block that is unchanged from priors.  There is no evidence of acute ischemia.  Patient is appropriate for discharge.  Amount and/or Complexity of Data Reviewed Labs: ordered.  Risk OTC drugs. Prescription drug management.        Final diagnoses:  Influenza  ED Discharge Orders          Ordered    oseltamivir  (TAMIFLU ) 75 MG capsule  Every 12 hours        06/12/24 1643               Mannie Pac T, DO 06/12/24 1643  "

## 2024-06-12 NOTE — Discharge Instructions (Addendum)
 You likely have the flu.  You have blood work today that was normal.  I sent a prescription for Tamiflu .  You may take it as prescribed.  You may take Motrin  and Tylenol  at home.

## 2024-06-12 NOTE — ED Notes (Signed)
 [place] pt to ED triage, pt stated she needed to use the restroom before we start. Pt was asked if she can wait until after triage due to time sensitive complaint, pt was unable to wait.
